# Patient Record
Sex: Female | Born: 1980
Health system: Southern US, Community
[De-identification: ages and names within clinical notes are randomized; demographics above are authoritative.]

## PROBLEM LIST (undated history)

## (undated) DIAGNOSIS — N63 Unspecified lump in unspecified breast: Secondary | ICD-10-CM

## (undated) DIAGNOSIS — N644 Mastodynia: Secondary | ICD-10-CM

## (undated) DIAGNOSIS — Z803 Family history of malignant neoplasm of breast: Secondary | ICD-10-CM

## (undated) DIAGNOSIS — N643 Galactorrhea not associated with childbirth: Secondary | ICD-10-CM

## (undated) HISTORY — PX: INTRAUTERINE DEVICE (IUD) INSERTION: SHX5877

## (undated) HISTORY — DX: Mastodynia: N64.4

## (undated) HISTORY — DX: Family history of malignant neoplasm of breast: Z80.3

## (undated) HISTORY — PX: SINUS SURGERY WITH INSTATRAK: SHX5215

## (undated) HISTORY — DX: Galactorrhea not associated with childbirth: N64.3

## (undated) HISTORY — DX: Unspecified lump in unspecified breast: N63.0

---

## 2004-03-02 ENCOUNTER — Observation Stay: Payer: Self-pay | Admitting: Obstetrics & Gynecology

## 2004-03-05 ENCOUNTER — Inpatient Hospital Stay: Payer: Self-pay | Admitting: Obstetrics & Gynecology

## 2005-03-03 ENCOUNTER — Ambulatory Visit: Payer: Self-pay

## 2005-10-25 ENCOUNTER — Ambulatory Visit: Payer: Self-pay | Admitting: Unknown Physician Specialty

## 2006-04-16 ENCOUNTER — Inpatient Hospital Stay: Payer: Self-pay

## 2011-05-23 DIAGNOSIS — N63 Unspecified lump in unspecified breast: Secondary | ICD-10-CM

## 2011-05-23 HISTORY — DX: Unspecified lump in unspecified breast: N63.0

## 2015-03-17 ENCOUNTER — Ambulatory Visit
Admission: EM | Admit: 2015-03-17 | Discharge: 2015-03-17 | Disposition: A | Discharge status: Home / Self Care | Payer: 59 | Attending: Family Medicine | Admitting: Family Medicine

## 2015-03-17 DIAGNOSIS — M7662 Achilles tendinitis, left leg: Secondary | ICD-10-CM

## 2015-03-17 MED ORDER — NAPROXEN 500 MG PO TABS: 500.0000 mg | ORAL_TABLET | Freq: Two times a day (BID) | ORAL | Status: DC

## 2015-03-17 NOTE — ED Notes (Signed)
Pt states "I have pain in the back of my left ankle ( started Saturday)and my calf is starting to hurt."

## 2015-03-17 NOTE — ED Provider Notes (Signed)
Patient presents today with symptoms of left Achilles tendon pain. Patient states that her symptoms started this weekend when she was jogging uphill at a football game. She states that she was running to the restroom. She started to have symptoms one or 2 hours after that. She denies any history of pain prior to this. She denies any history of ankle or foot problems in the past. She has used ice and taken ibuprofen intermittently for her symptoms since the incident. She has had some minimal calf tenderness which she attributes to the way that she is compensating walking due to her symptoms. She denies any history of DVT. She denies any chest pain or shortness of breath.  ROS: Negative except mentioned above. Vitals as per Epic.  GENERAL: NAD RESP: CTA B CARD: RRR MSK: L Ankle/Foot: no obvious swelling of Achilles or calf, tenderness localized to lateral aspect of insertion of Achilles tendon on calcaneus, no crepitus appreciated, FROM, tenderness reproduced with dorsiflexion, -Thompsons, -Homans, no retrocalcaneal bursitis appreciated, nv intact  NEURO: CN II-XII grossly intact   A/P: L Achilles Pain- Discussed with patient that symptoms suggestive of tendinitis/partial tear. Recommended the patient rest, ice, elevate. Discussed with the patient proper shoewear. At this time we will not do the walking boot but if her symptoms do persist or worsen I would recommend that for a short period of time. I have also given her a PT referral form. This will help her with rehab from this injury, including eccentric stretching when appropriate. If she continues to have any further issues she can follow up with me here at the urgent care or follow up with podiatry. Consider imaging at that time if needed.   Jolene ProvostKirtida Grady Lucci, MD 03/17/15 1041

## 2015-03-19 ENCOUNTER — Ambulatory Visit: Payer: 59 | Attending: Family Medicine | Admitting: Physical Therapy

## 2015-03-19 DIAGNOSIS — M7662 Achilles tendinitis, left leg: Secondary | ICD-10-CM | POA: Diagnosis present

## 2015-03-19 NOTE — Patient Instructions (Signed)
All exercises provided were adapted from hep2go.com. Patient was provided a written handout with pictures as described. Any additional cues were manually entered in to handout and copied in to this document.  ANKLE PUMPS - AP  Bend your foot up and down at your ankle joint as shown.    ** Try in water like a bath-tub.   SEATED CALF STRETCH - GASTROC  While sitting, use a towel or other strap looped around your foot. Gently pull your ankle back until a stretch is felt along the back of your lower leg.

## 2015-03-19 NOTE — Therapy (Signed)
[facility] [address] [place], [place], [address] Phone: [phone]   Fax:  [phone]  Physical Therapy Evaluation  Patient Details  Name: Caitlin Fox MRN: [id number] Date of Birth: 04/11/1981 No Data Recorded  Encounter Date: 03/19/2015      PT End of Session - 03/19/15 1054    Visit Number 1   Number of Visits 7   Date for PT Re-Evaluation 04/16/15   PT Start Time 1008   PT Stop Time 1052   PT Time Calculation (min) 44 min   Activity Tolerance Patient tolerated treatment well   Behavior During Therapy [facility] for tasks assessed/performed      No past medical history on file.  Past Surgical History  Procedure Laterality Date  . Sinus surgery with instatrak    . Cesarean section      There were no vitals filed for this visit.  Visit Diagnosis:  Achilles tendinitis of left lower extremity - Plan: PT plan of care cert/re-cert      Subjective Assessment - 03/19/15 1017    Subjective Patient reports she began feeling pain in her L heel Saturday night. She relates this to running up a hill to see her son's football game. She went to Urgent Care Sunday and was informed  she had a possible strain/tendonitis of her Achilles'.  She denies numbness/tingling, pain comes on with weight bearing and dorsiflexion.    Limitations Walking;Standing   Diagnostic tests Went to    Patient Stated Goals To decrease her pain   Currently in Pain? Yes   Pain Score 1   During ambulation it goes to a 5.   Pain Location Ankle   Pain Orientation Left   Pain Descriptors / Indicators Aching;Burning   Pain Type Acute pain   Pain Frequency Intermittent   Aggravating Factors  Weight bearing activities, Dorsiflexion   Pain Relieving Factors Non-weightbearing.             [facility] - 03/19/15 0001    Assessment   Medical Diagnosis --  L Achilles Tendonitis   Onset Date/Surgical Date 03/13/15   Precautions    Precautions None   Restrictions   Weight Bearing Restrictions No   Balance Screen   Has the patient fallen in the past 6 months No   Prior Function   Level of Independence Independent   Cognition   Overall Cognitive Status Within Functional Limits for tasks assessed   Sensation   Light Touch Appears Intact   PROM   Overall PROM Comments --  Ankle PROM Inv/ever/DF/PF symmetrical bilaterally   Strength   Right Knee Flexion 5/5   Right Knee Extension 5/5   Left Knee Flexion 5/5   Left Knee Extension 5/5   Right Ankle Dorsiflexion 5/5   Right Ankle Plantar Flexion 5/5   Right Ankle Inversion 5/5   Right Ankle Eversion 5/5   Left Ankle Dorsiflexion 5/5   Left Ankle Plantar Flexion 5/5   Left Ankle Inversion 5/5   Left Ankle Eversion 5/5   Palpation   Palpation comment --  Very tender near L spring ligament and Achilles' insertion   Thompson's Test   Findings Negative   Homan's Test   Findings Negative   Great Toe Extension Test    Comments --  WFL and symmetrical   Ambulation/Gait   Gait Comments --  Antalgic gait pattern with decreased stance time on LLE       Mild  deficit noted in DF with AROM pain limited on LLE, symmetrical PROM.  Gastroc stretching with towel with knee straight and 5" holds x 10. Found less painful after session.  Instructed patient in gait with crutch, noted little to no pain with crutch in RUE.                     PT Education - 03/19/15 1053    Education provided Yes   Education Details Patient educated to rest, elevate, ice as frequently as possible. Patient educated to use cane/crutch on RUE if she continues to experience high levels of discomfort as well as comfortable footwear.    Person(s) Educated Patient   Methods Explanation;Demonstration;Handout   Comprehension Verbalized understanding;Returned demonstration             PT Long Term Goals - 03/19/15 1101    PT LONG TERM GOAL #1   Title Patient will ambulate  with no deficits to return to full work duties.   Baseline Antalgic gait pattern.    Status New   PT LONG TERM GOAL #2   Title Patient will report an LEFS of greater than 70 to demonstrate increased tolerance for work related activities.    Baseline Not filled out at eval   Status New   PT LONG TERM GOAL #3   Title Patient will ascend and descend 1 flight of stairs with no increase in pain to return to work related activities.    Baseline Painful with all weightbearing.    Status New               Plan - 03/19/15 1056    Clinical Impression Statement Patient presents with acute onset L achilles insertional pain. Patient demonstrates full PROM, negative Thompson test, full strength on MMT, and tenderness over calacaneal insertion of Achilles, not of plantar fascia. Given her history of running up a hill, requiring significant PF activity it appears this is an acute strain and subsequent inflammatory related pain. Patient felt better after gastroc stretching provided with noticeable change in ambulation. Skilled PT services are indicated to address her pain symptoms.    Pt will benefit from skilled therapeutic intervention in order to improve on the following deficits Abnormal gait;Pain;Decreased mobility;Difficulty walking   Rehab Potential Excellent   Clinical Impairments Affecting Rehab Potential Acute onset, diminished pain   PT Frequency 2x / week   PT Duration 3 weeks   PT Treatment/Interventions Iontophoresis /ml Dexamethasone;Moist Heat;Cryotherapy;Electrical Stimulation;Ultrasound;Therapeutic activities;Therapeutic exercise;Manual techniques;Gait training;Stair training   PT Next Visit Plan Modalities and stretching for pain control.    PT Home Exercise Plan See patient instructions.    Consulted and Agree with Plan of Care Patient         Problem List There are no active problems to display for this patient.  Kerin Ransom, PT, DPT    03/19/2015, 11:11  AM  [facility] MAIN [facility] SERVICES [address] [place], [place], [address] Phone: [phone]   Fax:  [phone]  Name: Caitlin Fox MRN: [id number] Date of Birth: [birth year]

## 2015-03-23 ENCOUNTER — Encounter: Payer: Self-pay | Admitting: Physical Therapy

## 2015-03-23 ENCOUNTER — Ambulatory Visit: Payer: 59 | Attending: Family Medicine | Admitting: Physical Therapy

## 2015-03-23 DIAGNOSIS — M7662 Achilles tendinitis, left leg: Secondary | ICD-10-CM

## 2015-03-23 NOTE — Patient Instructions (Addendum)
  Plantarflexion (Eccentric), (Resistance Band)    Point foot down against resistance band. Slowly release for 3-5 seconds. Use ____red ____ resistance band. _15__ reps per set, __2_ sets per day, _6__ days per week.  http://ecce.exer.us/3   Copyright  VHI. All rights reserved.

## 2015-03-24 NOTE — Therapy (Signed)
[facility] MAIN [facility] SERVICES [address] [place], [place], [address] Phone: [phone]   Fax:  [phone]  Physical Therapy Treatment  Patient Details  Name: Caitlin Fox MRN: [id number] Date of Birth: 05-07-1981 No Data Recorded  Encounter Date: 03/23/2015      PT End of Session - 03/23/15 1820    Visit Number 2   Number of Visits 7   Date for PT Re-Evaluation 04/16/15   PT Start Time 1645   PT Stop Time 1730   PT Time Calculation (min) 45 min   Activity Tolerance Patient tolerated treatment well   Behavior During Therapy [facility] for tasks assessed/performed      History reviewed. No pertinent past medical history.  Past Surgical History  Procedure Laterality Date  . Sinus surgery with instatrak    . Cesarean section      There were no vitals filed for this visit.  Visit Diagnosis:  Achilles tendinitis of left lower extremity      Subjective Assessment - 03/23/15 1653    Subjective Patient reports that she is doing much better since PT evaluation. She states that she has been doing exercises prescribed at PT. She reports that she has not been using crutches and has not been using ankle brace regularly.    Limitations Walking;Standing   How long can you walk comfortably? --   Diagnostic tests --   Patient Stated Goals To decrease her pain   Currently in Pain? No/denies          Treatment:  Gentle L LE AROM:  PF/DF 2x 15  IV/IV 2x 15  BAPs level 2 PF/DF x 15  BAPs level 2 CCW circles x 10 BAPs level 2 CW circles x 10  PT attempted level 3 with BAPs board, but increased pain was noted in the lateral Achilles' tendon insertion, so all BAPs exercises performed at Level 2.   L ankle Eccentric PF red tband 2x10 Self stretch L LE DF with towel in long sitting, 10 second hold x 5   PT was required to provided min-Mod verbal instruction for proper exercise set up, to maintain motion in pain free range, and to  decrease compensation from hip and knee. Patient responded very well to instruction.  PT also provided education to maintain use of Rest, Ice, Compression and elevation as pain and swelling persists. Also instructed to increase use of crutches if pain and swelling increase  PT performed Ultrasound to the L Achilles' tendon insertion for 10 minutes, 1 MHz, 100% duty cycle, 1.7 w/cm2. Patient reports mild decrease in pain following Ultrasound treatment.                           PT Education - 03/23/15 1820    Education provided Yes   Education Details Gentle AROM exercises, ankle eccentrics    Person(s) Educated Patient   Methods Explanation;Demonstration;Tactile cues;Verbal cues   Comprehension Verbalized understanding;Returned demonstration;Verbal cues required             PT Long Term Goals - 03/24/15 1429    PT LONG TERM GOAL #1   Title Patient will ambulate with no deficits to return to full work duties. 04/16/15   Baseline Antalgic gait pattern.    Time 3   Period Weeks   Status New   PT LONG TERM GOAL #2   Title Patient will report an LEFS of greater than 70 to demonstrate  increased tolerance for work related activities. 04/16/15   Baseline Not filled out at eval   Time 3   Period Weeks   Status New   PT LONG TERM GOAL #3   Title Patient will ascend and descend 1 flight of stairs with no increase in pain to return to work related activities. 04/16/15   Baseline Painful with all weightbearing.    Time 3   Period Weeks   Status New               Plan - 03/24/15 [id number]    Clinical Impression Statement Patient demonstrated significant decrease in pain level compared to PT evaluation. Patient instructed in gentle AROM exercises as well as mild eccentric strengthening to aid in Achilles' tendon insertion healing. PT was required to provide min-moderate verbal instruction for proper exercise setup and cues to keep motion in pain free range. Ultrasound  performed on Achilles' tendon insertion to improve healing. Education provided to maintain Ice, compression and elevation for at least another week and to increase use of crutches if pain increases. Continued skilled PT is recommended to decrease pain and improve motion to allow return to PLOF.   Pt will benefit from skilled therapeutic intervention in order to improve on the following deficits Abnormal gait;Pain;Decreased mobility;Difficulty walking   Rehab Potential Excellent   Clinical Impairments Affecting Rehab Potential Acute onset, diminished pain   PT Frequency 2x / week   PT Duration 3 weeks   PT Treatment/Interventions Iontophoresis 4mg /ml Dexamethasone;Moist Heat;Cryotherapy;Electrical Stimulation;Ultrasound;Therapeutic activities;Therapeutic exercise;Manual techniques;Gait training;Stair training   PT Next Visit Plan Modalities and stretching for pain control.    PT Home Exercise Plan See patient instructions.    Consulted and Agree with Plan of Care Patient        Problem List There are no active problems to display for this patient.  Grier Rocherustin Patric Buckhalter SPT 03/24/2015   2:30 PM  This entire session was performed under direct supervision and direction of a licensed therapist . I have personally read, edited and approve of the note as written.  Hopkins,Margaret PT, DPT 03/24/2015, 2:30 PM  [facility] MA[facility] [address] [place], [place], [address] Phone: [phone]   Fax:  [phone]  Name: Caitlin Fox MRN: [id number] Date of Birth: 10/26/1980

## 2015-03-30 ENCOUNTER — Ambulatory Visit: Payer: 59 | Admitting: Physical Therapy

## 2015-03-30 ENCOUNTER — Encounter: Payer: Self-pay | Admitting: Physical Therapy

## 2015-03-30 DIAGNOSIS — M7662 Achilles tendinitis, left leg: Secondary | ICD-10-CM

## 2015-03-30 NOTE — Therapy (Signed)
[facility] [address] [place], [place], [address] Phone: [phone]   Fax:  [phone]  Physical Therapy Treatment  Patient Details  Name: Caitlin DickensKristi B Folks MRN: [id number] Date of Birth: 10/08/1980 No Data Recorded  Encounter Date: 03/30/2015      PT End of Session - 03/30/15 1045    Visit Number 3   Number of Visits 7   Date for PT Re-Evaluation 04/16/15   PT Start Time 0907   PT Stop Time 0949   PT Time Calculation (min) 42 min   Activity Tolerance Patient tolerated treatment well   Behavior During Therapy [facility] for tasks assessed/performed      History reviewed. No pertinent past medical history.  Past Surgical History  Procedure Laterality Date  . Sinus surgery with instatrak    . Cesarean section      There were no vitals filed for this visit.  Visit Diagnosis:  Achilles tendinitis of left lower extremity      Subjective Assessment - 03/30/15 0915    Subjective Patient states that her foot/heel is doing much better today. She report that with walking pain is only 1/10, but states that pain was 4/10 at the end of the day following work yesterday.    Limitations Walking;Standing   Patient Stated Goals To decrease her pain   Currently in Pain? Yes   Pain Score 1    Pain Location Ankle   Pain Orientation Left   Pain Descriptors / Indicators Aching   Pain Type Acute pain   Pain Frequency Intermittent            Treatment:  Gentle L LE AROM:  PF/DF 2x 15  IV/IV 2x 15  BAPs level 2 PF/DF 2x 15  BAPs level 2 IV/EV 2 x 15  BAPs level 2 CCW circles 2x 10 BAPs level 2 CW circles 2 x 10    L ankle Eccentric PF red tband 2x10 L ankle eccentric EV red band 2x 12  PT required to provided min verbal instruction for pain free ROM as well as proper speed of exercises to improve eccentric component. Patient responded very well to instruction.   Following therex.   PT performed manual  therapy including: PROM for the L ankle into DF x 8, 5 second hold  PROM for the L ankle into Inversion x 8 x 5 second hold PROM for the L ankle into eversion x 8 x 4 second hold STM to the Lateral proximal L calf to reduce pain from trigger point. X 3 minutes   Following Manual therapy  PT applied Iontophoresis, with 4mg /mL dexamethasone to L achilles tendon x10 min with instruction on safe wear. Patient reports slight pain after treatment session (1/10)                       PT Education - 03/30/15 [id number]    Education provided Yes   Education Details Gentle AROM, ankle eccentric exercises. benefit of Iontophoresis   Person(s) Educated Patient   Methods Explanation;Demonstration;Verbal cues   Comprehension Verbalized understanding;Returned demonstration;Verbal cues required             PT Long Term Goals - 03/24/15 1429    PT LONG TERM GOAL #1   Title Patient will ambulate with no deficits to return to full work duties. 04/16/15   Baseline Antalgic gait pattern.    Time 3   Period Weeks   Status New  PT LONG TERM GOAL #2   Title Patient will report an LEFS of greater than 70 to demonstrate increased tolerance for work related activities. 04/16/15   Baseline Not filled out at eval   Time 3   Period Weeks   Status New   PT LONG TERM GOAL #3   Title Patient will ascend and descend 1 flight of stairs with no increase in pain to return to work related activities. 04/16/15   Baseline Painful with all weightbearing.    Time 3   Period Weeks   Status New               Plan - 03/30/15 1046    Clinical Impression Statement Patient continues to demonstrate decreased pain levels in the L heel and foot. PT instructed patient in LE ROM exercises as well as gentle eccentrics for the L ankle. Patient required min verbal instruction for improved speed of movement and to focus on eccentric movements with therex. Mild discomfort noted at end range motion for DF  and inversion. PT also performed manual therapy including gentle PROM stretches and STM to trigger point noted in the lateral proximal L calf. Iontophoresis applied to the L calf to help with decreasing inflammation and pain. Continued skilled PT is recommended to decrease pain and improve motion in the L ankle to allow return to PLOF.     Pt will benefit from skilled therapeutic intervention in order to improve on the following deficits Abnormal gait;Pain;Decreased mobility;Difficulty walking   Rehab Potential Excellent   Clinical Impairments Affecting Rehab Potential Acute onset, diminished pain   PT Frequency 2x / week   PT Duration 3 weeks   PT Treatment/Interventions Iontophoresis /ml Dexamethasone;Moist Heat;Cryotherapy;Electrical Stimulation;Ultrasound;Therapeutic activities;Therapeutic exercise;Manual techniques;Gait training;Stair training   PT Next Visit Plan eccentric exercises. modalities.    PT Home Exercise Plan continue as given    Consulted and Agree with Plan of Care Patient        Problem List There are no active problems to display for this patient.  Grier Rocher SPT 03/30/2015   5:09 PM  This entire session was performed under direct supervision and direction of a licensed therapist. I have personally read, edited and approve of the note as written.  Hopkins,Margaret PT, DPT 03/30/2015, 5:09 PM  [facility] MAIN [facility] SERVICES [address] [place], [place], [address] Phone: [phone]   Fax:  [phone]  Name: Caitlin Fox MRN: [id number] Date of Birth: 08-Aug-1980

## 2015-04-06 ENCOUNTER — Encounter: Payer: Self-pay | Admitting: Physical Therapy

## 2015-04-06 ENCOUNTER — Ambulatory Visit: Payer: 59 | Admitting: Physical Therapy

## 2015-04-06 DIAGNOSIS — M7662 Achilles tendinitis, left leg: Secondary | ICD-10-CM | POA: Diagnosis not present

## 2015-04-06 NOTE — Therapy (Signed)
[place] [facility] MAIN [facility] SERVICES [address] [place], [place], [address] Phone: [phone]   Fax:  [phone]  Physical Therapy Treatment/ Discharge summary  Patient Details  Name: Caitlin Fox MRN: [id number] Date of Birth: 1981/01/21 No Data Recorded  Encounter Date: 04/06/2015      PT End of Session - 04/06/15 0934    Visit Number 4   Number of Visits 7   Date for PT Re-Evaluation 04/16/15   PT Start Time 0830   PT Stop Time 0915   PT Time Calculation (min) 45 min   Activity Tolerance Patient tolerated treatment well   Behavior During Therapy [facility] for tasks assessed/performed      History reviewed. No pertinent past medical history.  Past Surgical History  Procedure Laterality Date  . Sinus surgery with instatrak    . Cesarean section      There were no vitals filed for this visit.  Visit Diagnosis:  Achilles tendinitis of left lower extremity      Subjective Assessment - 04/06/15 0933    Subjective Patient states that her foot/heel is doing much better today. She report that with walking pain is only 0/10.    Limitations Walking;Standing   Patient Stated Goals To decrease her pain         PT performed manual therapy including: RROM left ankle DF, PF, inv/eve x 10 x 3 Baps board cW, CCW x 20 STM to the Lateral proximal L calf to reduce pain from trigger point. X 3 minutes [place] to left calf and heel cord x 10 1.5 CM sq.  No reports of pain to left ankle.                         PT Education - 04/06/15 0934    Education provided Yes   Person(s) Educated Patient   Methods Explanation   Comprehension Verbalized understanding             PT Long Term Goals - 03/24/15 1429    PT LONG TERM GOAL #1   Title Patient will ambulate with no deficits to return to full work duties. 04/16/15   Baseline Antalgic gait pattern.    Time 3   Period Weeks   Status New   PT LONG TERM GOAL #2   Title Patient will report an LEFS of greater than 70 to demonstrate increased tolerance for work related activities. 04/16/15   Baseline Not filled out at eval   Time 3   Period Weeks   Status New   PT LONG TERM GOAL #3   Title Patient will ascend and descend 1 flight of stairs with no increase in pain to return to work related activities. 04/16/15   Baseline Painful with all weightbearing.    Time 3   Period Weeks   Status New               Plan - 04/06/15 0935    Clinical Impression Statement Patient is able tp perform resisted exercises including DF, PF, inv/eve with RTB and tolerate [place] to left heel cord. She has no pain after treatment.    Pt will benefit from skilled therapeutic intervention in order to improve on the following deficits Abnormal gait;Pain;Decreased mobility;Difficulty walking   Rehab Potential Excellent   Clinical Impairments Affecting Rehab Potential Acute onset, diminished pain   PT Frequency 2x / week   PT Duration 3 weeks   PT Treatment/Interventions  Iontophoresis 4mg /ml Dexamethasone;Moist Heat;Cryotherapy;Electrical Stimulation;Ultrasound;Therapeutic activities;Therapeutic exercise;Manual techniques;Gait training;Stair training   PT Next Visit Plan eccentric exercises. modalities.    PT Home Exercise Plan continue as given    Consulted and Agree with Plan of Care Patient        Problem List There are no active problems to display for this patient.  Ezekiel InaKristine S Payson Evrard, PT, DPT [place], Barkley BrunsKristine S 04/06/2015, 9:38 AM  [place] [facility] [address] [place], [place], [address] Phone: [phone]   Fax:  [phone]  Name: Waldemar DickensKristi B Pomales MRN: [id number] Date of Birth: [birth year]

## 2015-11-29 ENCOUNTER — Ambulatory Visit: Payer: Self-pay | Admitting: Registered Nurse

## 2015-11-29 ENCOUNTER — Encounter: Payer: Self-pay | Admitting: Physician Assistant

## 2015-11-29 VITALS — BP 100/70 | HR 60 | Temp 97.9°F

## 2015-11-29 DIAGNOSIS — L255 Unspecified contact dermatitis due to plants, except food: Secondary | ICD-10-CM

## 2015-11-29 MED ORDER — PREDNISONE 20 MG PO TABS: 60.0000 mg | ORAL_TABLET | Freq: Every day | ORAL | Status: AC

## 2015-11-29 NOTE — Progress Notes (Signed)
Subjective:    Patient ID: Caitlin Fox, female    DOB: 10-Jan-1981, 35 y.o.   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  HENT: Negative for congestion, dental problem, drooling, ear discharge, ear pain, facial swelling, hearing loss, mouth sores, nosebleeds, postnasal drip, rhinorrhea, sinus pressure, sneezing, sore throat, tinnitus, trouble swallowing and voice change.   Eyes: Negative for photophobia, pain, discharge, redness, itching and visual disturbance.  Respiratory: Negative for cough, choking, chest tightness, shortness of breath, wheezing and stridor.   Cardiovascular: Negative for chest pain, palpitations and leg swelling.  Gastrointestinal: Negative for nausea, vomiting, abdominal pain, diarrhea, constipation, blood in stool and abdominal distention.  Endocrine: Negative for cold intolerance and heat intolerance.  Genitourinary: Negative for dysuria, hematuria and difficulty urinating.  Musculoskeletal: Negative for myalgias, back pain, joint swelling, arthralgias, gait problem, neck pain and neck stiffness.  Skin: Positive for color change and rash. Negative for pallor and wound.  Allergic/Immunologic: Positive for environmental allergies. Negative for food allergies.  Neurological: Negative for dizziness, tremors, seizures, syncope, facial asymmetry, speech difficulty, weakness, light-headedness, numbness and headaches.  Hematological: Negative for  adenopathy. Does not bruise/bleed easily.  Psychiatric/Behavioral: Positive for sleep disturbance. Negative for behavioral problems, confusion and agitation.       Objective:   Physical Exam  Constitutional: She is oriented to person, place, and time. Vital signs are normal. She appears well-developed and well-nourished. She is active and cooperative.  Non-toxic appearance. She does not have a sickly appearance. She does not appear ill. No distress.  HENT:  Head: Normocephalic and atraumatic.  Right Ear: Hearing, external ear and ear canal normal. A middle ear effusion is present.  Left Ear: Hearing, external ear and ear canal normal. A middle ear effusion is present.  Nose: Mucosal edema and rhinorrhea present. No nose lacerations, sinus tenderness, nasal deformity, septal deviation or nasal septal hematoma. No epistaxis.  No foreign bodies. Right sinus exhibits maxillary sinus tenderness and frontal sinus tenderness. Left sinus exhibits no maxillary sinus tenderness and no frontal sinus tenderness.  Mouth/Throat: Uvula is midline and mucous membranes are normal. Mucous membranes are not pale, not dry and not cyanotic. She does not have dentures. No oral lesions. No trismus in the jaw. Normal dentition. No dental abscesses, uvula swelling, lacerations or dental caries. Posterior oropharyngeal edema and posterior oropharyngeal erythema present. No oropharyngeal exudate or tonsillar abscesses.  Eyes: Conjunctivae, EOM and lids are normal. Pupils are equal, round, and reactive to light. Right eye exhibits no chemosis, no discharge, no exudate and no hordeolum. No foreign body present in the right eye. Left eye exhibits no chemosis, no discharge, no exudate and no hordeolum. No foreign body present in the left eye. Right conjunctiva is not injected. Right conjunctiva has no hemorrhage. Left conjunctiva is not injected. Left conjunctiva has no hemorrhage. No scleral icterus. Right eye exhibits normal  extraocular motion and no nystagmus. Left eye exhibits normal extraocular motion and no nystagmus. Right pupil is round and reactive. Left pupil is round and reactive. Pupils  are equal.  Neck: Trachea normal and normal range of motion. Neck supple. No tracheal tenderness, no spinous process tenderness and no muscular tenderness present. No rigidity. No tracheal deviation, no edema, no erythema and normal range of motion present. No thyroid mass and no thyromegaly present.  Cardiovascular: Normal rate, regular rhythm, S1 normal, S2 normal, normal heart sounds and intact distal pulses.  PMI is not displaced.  Exam reveals no gallop and no friction rub.   No murmur heard. Pulmonary/Chest: Effort normal and breath sounds normal. No accessory muscle usage or stridor. No respiratory distress. She has no decreased breath sounds. She has no wheezes. She has no rhonchi. She has no rales. She exhibits no tenderness.  Abdominal: Soft. She exhibits no distension.  Musculoskeletal: Normal range of motion. She exhibits no edema or tenderness.       Right shoulder: Normal.       Left shoulder: Normal.       Right hip: Normal.       Left hip: Normal.       Right knee: Normal.       Left knee: Normal.       Cervical back: Normal.       Right hand: Normal.       Left hand: Normal.  Lymphadenopathy:       Head (right side): No submental and no submandibular adenopathy present.       Head (left side): No submental and no submandibular adenopathy present.    She has no cervical adenopathy.       Right cervical: No superficial cervical adenopathy present.      Left cervical: No superficial cervical adenopathy present.  Neurological: She is alert and oriented to person, place, and time. She has normal strength. She is not disoriented. She displays no atrophy and no tremor. No cranial nerve deficit or sensory deficit. She exhibits normal muscle tone. She displays no seizure activity. Coordination and gait normal. GCS  eye subscore is 4. GCS verbal subscore is 5. GCS motor subscore is 6.  Skin: Skin is warm, dry and intact. Ecchymosis and rash noted. No abrasion, no bruising, no burn, no laceration, no lesion, no petechiae and no purpura noted. Rash is maculopapular. Rash is not macular, not papular, not nodular, not pustular, not vesicular and not urticarial. She is not diaphoretic. There is erythema. No cyanosis. No pallor. Nails show no clubbing.     Psychiatric: She has a normal mood and affect. Her speech is normal and behavior is normal. Judgment and thought content normal. Cognition and memory are normal.  Nursing note and vitals reviewed.         Assessment & Plan:  A- contact dermatitis due to plants  Symptomatic therapy suggested e.g. Calamine lotion, zyrtec 10mg  po qam; benadryl 25-50mg  po bedtime prn breakthrough itching.  60mg  prednisone taper over 21 days  May use ice if breakthrough itching.  Warm to cool water soaks and/or oatmeal baths.  Call or return to clinic as needed if these symptoms worsen or fail to improve as anticipated especially lesions noted on eye, visual changes or visual loss, fever, and/or purulent discharge.    Discussed avoidance/no contact wear of long sleeves/pants/socks/gloves and handkerchief around neck/mouth/face and use of poison ivy block cream along with tepid shower immediately after completion of yard work.  Avoid scratching lesions to prevent secondary infections.   Patient verbalized agreement and understanding of treatment plan and had no further questions at this time.

## 2016-03-15 DIAGNOSIS — Z124 Encounter for screening for malignant neoplasm of cervix: Secondary | ICD-10-CM | POA: Diagnosis not present

## 2016-03-15 DIAGNOSIS — Z01419 Encounter for gynecological examination (general) (routine) without abnormal findings: Secondary | ICD-10-CM | POA: Diagnosis not present

## 2016-03-15 DIAGNOSIS — T8339XA Other mechanical complication of intrauterine contraceptive device, initial encounter: Secondary | ICD-10-CM | POA: Diagnosis not present

## 2016-06-12 DIAGNOSIS — Z30433 Encounter for removal and reinsertion of intrauterine contraceptive device: Secondary | ICD-10-CM | POA: Diagnosis not present

## 2016-07-04 DIAGNOSIS — H52213 Irregular astigmatism, bilateral: Secondary | ICD-10-CM | POA: Diagnosis not present

## 2016-07-13 DIAGNOSIS — Z30431 Encounter for routine checking of intrauterine contraceptive device: Secondary | ICD-10-CM | POA: Diagnosis not present

## 2016-11-13 ENCOUNTER — Ambulatory Visit: Payer: Self-pay | Admitting: Physician Assistant

## 2016-11-13 ENCOUNTER — Ambulatory Visit
Admission: RE | Admit: 2016-11-13 | Discharge: 2016-11-13 | Disposition: A | Discharge status: Home / Self Care | Payer: 59 | Source: Ambulatory Visit | Attending: Physician Assistant | Admitting: Physician Assistant

## 2016-11-13 ENCOUNTER — Encounter: Payer: Self-pay | Admitting: Physician Assistant

## 2016-11-13 VITALS — BP 140/90 | HR 76 | Temp 98.7°F

## 2016-11-13 DIAGNOSIS — M25562 Pain in left knee: Secondary | ICD-10-CM | POA: Diagnosis not present
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
## 2017-10-31 ENCOUNTER — Encounter: Payer: Self-pay | Admitting: Family Medicine

## 2017-10-31 ENCOUNTER — Ambulatory Visit (INDEPENDENT_AMBULATORY_CARE_PROVIDER_SITE_OTHER): Payer: Self-pay | Admitting: Family Medicine

## 2017-10-31 VITALS — BP 130/80 | HR 73 | Temp 98.3°F | Ht 66.0 in | Wt 198.0 lb

## 2017-10-31 DIAGNOSIS — Z0000 Encounter for general adult medical examination without abnormal findings: Secondary | ICD-10-CM

## 2017-10-31 LAB — POCT URINALYSIS DIPSTICK
Bilirubin, UA: NEGATIVE
Glucose, UA: NEGATIVE
KETONES UA: NEGATIVE
NITRITE UA: NEGATIVE
PH UA: 6 (ref 5.0–8.0)
PROTEIN UA: NEGATIVE
Spec Grav, UA: 1.025 (ref 1.010–1.025)
UROBILINOGEN UA: 0.2 U/dL

## 2017-10-31 NOTE — Progress Notes (Signed)
Subjective:  Caitlin DickensKristi B Fox is a 37 y.o. female who presents for wellness visit to satisfy employer health benefit requirements. She is currently without a primary care provider. She is overdue for her PAP smear although has a visit scheduled in July. Family history significant for type 2 diabetes affecting both parents (mother is decreased). She has never obtained a baseline A1C reports an abnormal fasting glucose six months prior. 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 Current Body mass index is 31.96 kg/m.' Patient denies any current health related concerns.  Past Surgical History:  Procedure Laterality Date  . CESAREAN SECTION    . SINUS SURGERY WITH INSTATRAK      Social History   Tobacco Use  . Smoking status: Never Smoker  . Smokeless tobacco: Never Used  Substance Use Topics  . Alcohol use: No  . Drug use: No   No Known Allergies  Current Outpatient Medications  Medication Sig Dispense Refill  . ibuprofen (ADVIL,MOTRIN) 400 MG tablet Take 400 mg by mouth every 6 (six) hours as needed.    . naproxen (NAPROSYN) 500 MG tablet Take 1 tablet (500 mg total) by mouth 2 (two) times daily. (Patient not taking: Reported on 11/29/2015) 30 tablet 0   No current facility-administered medications for this visit.    Review of Systems  Constitutional: Negative.   HENT: Negative.   Eyes:       She wears corrective lenses (contacts)  Respiratory: Negative.   Cardiovascular: Negative.   Gastrointestinal: Negative.   Genitourinary: Negative.   Musculoskeletal: Negative.   Skin: Negative.   Neurological: Negative.   Psychiatric/Behavioral: Negative.    Objective:  BP 130/80   Pulse 73   Temp 98.3 F (36.8 C)   Ht 5\' 6"  (1.676 m)   Wt 198 lb (89.8 kg)   SpO2 99%   BMI 31.96 kg/m     Visual Acuity Screening   Right eye Left eye Both eyes  Without correction:      With correction: 20/20 20/25 20/20     General Appearance:  Alert, cooperative, no distress, appears stated age  Head:  Normocephalic, without obvious abnormality, atraumatic  Eyes:  PERRL, conjunctiva/corneas clear, EOM's intact, fundi benign, both eyes  Ears:  Normal TM's and external ear canals, both ears  Nose: Nares normal, septum midline,mucosa normal, no drainage or sinus tenderness  Throat: Lips, mucosa, and tongue normal; teeth and gums normal  Neck: Supple, symmetrical, trachea midline, no adenopathy;  thyroid: not enlarged, symmetric, no tenderness/mass/nodules; no carotid bruit or JVD  Back:   Symmetric, no curvature, ROM normal, no CVA tenderness  Lungs:   Clear to auscultation bilaterally, respirations unlabored  Heart:  Regular rate and rhythm, S1 and S2 normal, no murmur, rub, or gallop  Abdomen:   Soft, non-tender, bowel sounds active all four quadrants,  no masses, no organomegaly  Pelvic: Deferred  Extremities: Extremities normal, atraumatic, no cyanosis or edema  Pulses: 2+ and symmetric  Skin: Skin color, texture, turgor normal, no rashes or lesions  Lymph nodes: Cervical nodes normal  Neurologic: Normal   Assessment and Plan:  Wellness Examination-Age appropriate anticipatory guidance provided.  RBC noted on urine dipstick. No prior UA on file to compare. Recommended follow-up UA with GYN provider at upcoming appointment. Strong family history of type 2 diabetes-recommended baseline A1C be obtained. Recommended establishing with a PCP and obtaining all routine screening  labs.  Recommended routine physical activity. Patient education provided. Patient will follow up with PCP.  Godfrey Pick. Tiburcio Pea, MSN, FNP-C [facility]  [id number]  [place], [place] [address] [phone]

## 2017-10-31 NOTE — Patient Instructions (Addendum)
Follow-up with and establish with a primary care in order to obtain routine labs. I recommend a obtaining a baseline hemoglobin A1C and repeat urinalysis to evaluate hematuria noted on urine dipstick today.   Preventing Type 2 Diabetes Mellitus Type 2 diabetes (type 2 diabetes mellitus) is a long-term (chronic) disease that affects blood sugar (glucose) levels. Normally, a hormone called insulin allows glucose to enter cells in the body. The cells use glucose for energy. In type 2 diabetes, one or both of these problems may be present:  The body does not make enough insulin.  The body does not respond properly to insulin that it makes (insulin resistance).  Insulin resistance or lack of insulin causes excess glucose to build up in the blood instead of going into cells. As a result, high blood glucose (hyperglycemia) develops, which can cause many complications. Being overweight or obese and having an inactive (sedentary) lifestyle can increase your risk for diabetes. Type 2 diabetes can be delayed or prevented by making certain nutrition and lifestyle changes. What nutrition changes can be made?  Eat healthy meals and snacks regularly. Keep a healthy snack with you for when you get hungry between meals, such as fruit or a handful of nuts.  Eat lean meats and proteins that are low in saturated fats, such as chicken, fish, egg whites, and beans. Avoid processed meats.  Eat plenty of fruits and vegetables and plenty of grains that have not been processed (whole grains). It is recommended that you eat: ? 1?2 cups of fruit every day. ? 2?3 cups of vegetables every day. ? 6?8 oz of whole grains every day, such as oats, whole wheat, bulgur, brown rice, quinoa, and millet.  Eat low-fat dairy products, such as milk, yogurt, and cheese.  Eat foods that contain healthy fats, such as nuts, avocado, olive oil, and canola oil.  Drink water throughout the day. Avoid drinks that contain added sugar,  such as soda or sweet tea.  Follow instructions from your health care provider about specific eating or drinking restrictions.  Control how much food you eat at a time (portion size). ? Check food labels to find out the serving sizes of foods. ? Use a kitchen scale to weigh amounts of foods.  Saute or steam food instead of frying it. Cook with water or broth instead of oils or butter.  Limit your intake of: ? Salt (sodium). Have no more than 1 tsp (2,400 mg) of sodium a day. If you have heart disease or high blood pressure, have less than ? tsp (1,500 mg) of sodium a day. ? Saturated fat. This is fat that is solid at room temperature, such as butter or fat on meat. What lifestyle changes can be made?  Activity  Do moderate-intensity physical activity for at least 30 minutes on at least 5 days of the week, or as much as told by your health care provider.  Ask your health care provider what activities are safe for you. A mix of physical activities may be best, such as walking, swimming, cycling, and strength training.  Try to add physical activity into your day. For example: ? Park in spots that are farther away than usual, so that you walk more. For example, park in a far corner of the parking lot when you go to the office or the grocery store. ? Take a walk during your lunch break. ? Use stairs instead of elevators or escalators. Weight Loss  Lose weight as directed. Your health care  provider can determine how much weight loss is best for you and can help you lose weight safely.  If you are overweight or obese, you may be instructed to lose at least 5?7 % of your body weight. Alcohol and Tobacco   Limit alcohol intake to no more than 1 drink a day for nonpregnant women and 2 drinks a day for men. One drink equals 12 oz of beer, 5 oz of wine, or 1 oz of hard liquor.  Do not use any tobacco products, such as cigarettes, chewing tobacco, and e-cigarettes. If you need help quitting,  ask your health care provider. Work With [place] Provider  Have your blood glucose tested regularly, as told by your health care provider.  Discuss your risk factors and how you can reduce your risk for diabetes.  Get screening tests as told by your health care provider. You may have screening tests regularly, especially if you have certain risk factors for type 2 diabetes.  Make an appointment with a diet and nutrition specialist (registered dietitian). A registered dietitian can help you make a healthy eating plan and can help you understand portion sizes and food labels. Why are these changes important?  It is possible to prevent or delay type 2 diabetes and related health problems by making lifestyle and nutrition changes.  It can be difficult to recognize signs of type 2 diabetes. The best way to avoid possible damage to your body is to take actions to prevent the disease before you develop symptoms. What can happen if changes are not made?  Your blood glucose levels may keep increasing. Having high blood glucose for a long time is dangerous. Too much glucose in your blood can damage your blood vessels, heart, kidneys, nerves, and eyes.  You may develop prediabetes or type 2 diabetes. Type 2 diabetes can lead to many chronic health problems and complications, such as: ? Heart disease. ? Stroke. ? Blindness. ? Kidney disease. ? Depression. ? Poor circulation in the feet and legs, which could lead to surgical removal (amputation) in severe cases. Where to find support:  Ask your health care provider to recommend a registered dietitian, diabetes educator, or weight loss program.  Look for local or online weight loss groups.  Join a gym, fitness club, or outdoor activity group, such as a walking club. Where to find more information: To learn more about diabetes and diabetes prevention, visit:  American Diabetes Association (ADA): www.diabetes.[employer] of  Diabetes and Digestive and Kidney Diseases: FindSpin.nl  To learn more about healthy eating, visit:  The U.S. Department of Agriculture Scientist, research (physical sciences)), Choose My Plate: http://wiley-williams.com/  Office of Disease Prevention and Health Promotion (ODPHP), Dietary Guidelines: SurferLive.at  Summary  You can reduce your risk for type 2 diabetes by increasing your physical activity, eating healthy foods, and losing weight as directed.  Talk with your health care provider about your risk for type 2 diabetes. Ask about any blood tests or screening tests that you need to have. This information is not intended to replace advice given to you by your health care provider. Make sure you discuss any questions you have with your health care provider. Document Released: 08/30/2015 Document Revised: 10/14/2015 Document Reviewed: 06/29/2015 Elsevier Interactive Patient Education  2018 [place] 18-39 Years, Female Preventive care refers to lifestyle choices and visits with your health care provider that can promote health and wellness. What does preventive care include?  A yearly physical  exam. This is also called an annual well check.  Dental exams once or twice a year.  Routine eye exams. Ask your health care provider how often you should have your eyes checked.  Personal lifestyle choices, including: ? Daily care of your teeth and gums. ? Regular physical activity. ? Eating a healthy diet. ? Avoiding tobacco and drug use. ? Limiting alcohol use. ? Practicing safe sex. ? Taking vitamin and mineral supplements as recommended by your health care provider. What happens during an annual well check? The services and screenings done by your health care provider during your annual well check will depend on your age, overall health, lifestyle risk factors, and family history of disease. Counseling Your health care  provider may ask you questions about your:  Alcohol use.  Tobacco use.  Drug use.  Emotional well-being.  Home and relationship well-being.  Sexual activity.  Eating habits.  Work and work Statistician.  Method of birth control.  Menstrual cycle.  Pregnancy history.  Screening You may have the following tests or measurements:  Height, weight, and BMI.  Diabetes screening. This is done by checking your blood sugar (glucose) after you have not eaten for a while (fasting).  Blood pressure.  Lipid and cholesterol levels. These may be checked every 5 years starting at age 53.  Skin check.  Hepatitis C blood test.  Hepatitis B blood test.  Sexually transmitted disease (STD) testing.  BRCA-related cancer screening. This may be done if you have a family history of breast, ovarian, tubal, or peritoneal cancers.  Pelvic exam and Pap test. This may be done every 3 years starting at age 58. Starting at age 76, this may be done every 5 years if you have a Pap test in combination with an HPV test.  Discuss your test results, treatment options, and if necessary, the need for more tests with your health care provider. Vaccines Your health care provider may recommend certain vaccines, such as:  Influenza vaccine. This is recommended every year.  Tetanus, diphtheria, and acellular pertussis (Tdap, Td) vaccine. You may need a Td booster every 10 years.  Varicella vaccine. You may need this if you have not been vaccinated.  HPV vaccine. If you are 21 or younger, you may need three doses over 6 months.  Measles, mumps, and rubella (MMR) vaccine. You may need at least one dose of MMR. You may also need a second dose.  Pneumococcal 13-valent conjugate (PCV13) vaccine. You may need this if you have certain conditions and were not previously vaccinated.  Pneumococcal polysaccharide (PPSV23) vaccine. You may need one or two doses if you smoke cigarettes or if you have certain  conditions.  Meningococcal vaccine. One dose is recommended if you are age 59-21 years and a first-year college student living in a residence hall, or if you have one of several medical conditions. You may also need additional booster doses.  Hepatitis A vaccine. You may need this if you have certain conditions or if you travel or work in places where you may be exposed to hepatitis A.  Hepatitis B vaccine. You may need this if you have certain conditions or if you travel or work in places where you may be exposed to hepatitis B.  Haemophilus influenzae type b (Hib) vaccine. You may need this if you have certain risk factors.  Talk to your health care provider about which screenings and vaccines you need and how often you need them. This information is not intended to replace  advice given to you by your health care provider. Make sure you discuss any questions you have with your health care provider. Document Released: 07/04/2001 Document Revised: 01/26/2016 Document Reviewed: 03/09/2015 Elsevier Interactive Patient Education  [employer].

## 2017-11-26 DIAGNOSIS — H52213 Irregular astigmatism, bilateral: Secondary | ICD-10-CM | POA: Diagnosis not present

## 2017-11-27 ENCOUNTER — Ambulatory Visit (INDEPENDENT_AMBULATORY_CARE_PROVIDER_SITE_OTHER): Payer: 59 | Admitting: Certified Nurse Midwife

## 2017-11-27 ENCOUNTER — Other Ambulatory Visit (HOSPITAL_COMMUNITY)
Admission: RE | Admit: 2017-11-27 | Discharge: 2017-11-27 | Disposition: A | Discharge status: Home / Self Care | Payer: 59 | Source: Ambulatory Visit | Attending: Certified Nurse Midwife | Admitting: Certified Nurse Midwife

## 2017-11-27 ENCOUNTER — Encounter: Payer: Self-pay | Admitting: Certified Nurse Midwife

## 2017-11-27 VITALS — BP 120/80 | HR 72 | Ht 66.0 in | Wt 198.0 lb

## 2017-11-27 DIAGNOSIS — Z124 Encounter for screening for malignant neoplasm of cervix: Secondary | ICD-10-CM

## 2017-11-27 DIAGNOSIS — Z01419 Encounter for gynecological examination (general) (routine) without abnormal findings: Secondary | ICD-10-CM

## 2017-11-27 DIAGNOSIS — Z1231 Encounter for screening mammogram for malignant neoplasm of breast: Secondary | ICD-10-CM

## 2017-11-27 DIAGNOSIS — Z833 Family history of diabetes mellitus: Secondary | ICD-10-CM

## 2017-11-27 DIAGNOSIS — Z131 Encounter for screening for diabetes mellitus: Secondary | ICD-10-CM | POA: Diagnosis not present

## 2017-11-27 DIAGNOSIS — R319 Hematuria, unspecified: Secondary | ICD-10-CM | POA: Diagnosis not present

## 2017-11-27 DIAGNOSIS — Z01411 Encounter for gynecological examination (general) (routine) with abnormal findings: Secondary | ICD-10-CM | POA: Diagnosis not present

## 2017-11-27 DIAGNOSIS — Z803 Family history of malignant neoplasm of breast: Secondary | ICD-10-CM

## 2017-11-27 DIAGNOSIS — Z1239 Encounter for other screening for malignant neoplasm of breast: Secondary | ICD-10-CM

## 2017-11-27 LAB — POCT URINALYSIS DIPSTICK
Bilirubin, UA: NEGATIVE
Glucose, UA: NEGATIVE
KETONES UA: NEGATIVE
Nitrite, UA: NEGATIVE
Protein, UA: NEGATIVE
SPEC GRAV UA: 1.015 (ref 1.010–1.025)
Urobilinogen, UA: NEGATIVE E.U./dL — AB
pH, UA: 7.5 (ref 5.0–8.0)

## 2017-11-27 NOTE — Progress Notes (Signed)
Gynecology Annual Exam  PCP: Patient, No Pcp Per  Chief Complaint:  Chief Complaint  Patient presents with  . Gynecologic Exam    repeat u/a d/t 1+ blood in last test    History of Present Illness: Caitlin Fox is a 37 y.o. WF G2P2002 who presents for her annual exam. The patient has no complaints today. Had a physical exam for her insurance last month and there was +1 blood on dipstick. No dysuria or gross hematuria. No history of kidney stones.  Her menses are absent due to the MIrena IUD.   Last pap smear: 03/15/2016, results were NIL   The patient is sexually active. She currently uses Mirena IUD for contraception. The Mirena was replaced 06/12/2016. She does not have dyspareunia.  Since her last visit, she has graduated with her DNP. She has also gained 18#.   Her past medical history is remarkable for a history of a right breast mass (from adipose tissue) in the UOQ identified by Dr Lemar Livings. She reports symptoms of tenderness cyclically  The patient does perform self breast exams. Her last mammogram was NA  There is a family history of breast cancer in her maternal grandmother Genetic testing has not been done. Patient would like to have a mammogram scheduled due to the young age of her MGM at diagnosis (late 30s/early 48s).  There is no family history of ovarian cancer.   The patient denies smoking.  She denies drinking.   She denies illegal drug use.  The patient reports exercising regularly-walking 20,000 steps daily. Has recently started back going to aerobics classes. She does get adequate calcium in her diet.  Most recent lipid panel was done 2015 and was normal.  The patient denies current symptoms of depression.    Review of Systems: Review of Systems  Constitutional: Negative for chills, fever and weight loss.  HENT: Negative for congestion, sinus pain and sore throat.   Eyes: Negative for blurred vision and pain.  Respiratory: Negative for  hemoptysis, shortness of breath and wheezing.   Cardiovascular: Negative for chest pain, palpitations and leg swelling.  Gastrointestinal: Negative for abdominal pain, blood in stool, diarrhea, heartburn, nausea and vomiting.  Genitourinary: Positive for hematuria (on dipstick). Negative for dysuria, frequency and urgency.       Positive for amenorrhea  Musculoskeletal: Negative for back pain, joint pain and myalgias.  Skin: Negative for itching and rash.  Neurological: Negative for dizziness, tingling and headaches.  Endo/Heme/Allergies: Negative for environmental allergies and polydipsia. Does not bruise/bleed easily.       Negative for hirsutism   Psychiatric/Behavioral: Negative for depression. The patient is not nervous/anxious and does not have insomnia.     Past Medical History:  Past Medical History:  Diagnosis Date  . Breast mass in female 2013   cystic lesion. followed by Dr. Lemar Livings for breast complaints  . Galactorrhea   . Mastodynia, female     Past Surgical History:  Past Surgical History:  Procedure Laterality Date  . CESAREAN SECTION  2007   BVD/ FITL  . INTRAUTERINE DEVICE (IUD) INSERTION  06-14-2006, 06-16-2011, 06/12/2016  . SINUS SURGERY WITH INSTATRAK      Family History:  Family History  Problem Relation Age of Onset  . Diabetes Mother        type 2  . Hypertension Mother   . Pulmonary disease Mother   . Diabetes Father 2       type 2  . Hyperlipidemia  Father   . Hypertension Father   . Breast cancer Maternal Grandmother 92       DIED IN HER 60s respiratory failure  . Hypertension Paternal Grandmother     Social History:  Social History   Socioeconomic History  . Marital status: Married    Spouse name: Not on file  . Number of children: 2  . Years of education: 6  . Highest education level: Not on file  Occupational History  . Occupation: Charity fundraiser  Social Needs  . Financial resource strain: Not on file  . Food insecurity:    Worry: Not on  file    Inability: Not on file  . Transportation needs:    Medical: Not on file    Non-medical: Not on file  Tobacco Use  . Smoking status: Never Smoker  . Smokeless tobacco: Never Used  Substance and Sexual Activity  . Alcohol use: No  . Drug use: No  . Sexual activity: Yes    Birth control/protection: IUD  Lifestyle  . Physical activity:    Days per week: Not on file    Minutes per session: Not on file  . Stress: Not on file  Relationships  . Social connections:    Talks on phone: Not on file    Gets together: Not on file    Attends religious service: Not on file    Active member of club or organization: Not on file    Attends meetings of clubs or organizations: Not on file    Relationship status: Not on file  . Intimate partner violence:    Fear of current or ex partner: Not on file    Emotionally abused: Not on file    Physically abused: Not on file    Forced sexual activity: Not on file  Other Topics Concern  . Not on file  Social History Narrative  . Not on file    Allergies:  No Known Allergies  Medications:  Current Outpatient Medications on File Prior to Visit  Medication Sig Dispense Refill  . ibuprofen (ADVIL,MOTRIN) 400 MG tablet Take 400 mg by mouth every 6 (six) hours as needed.    Marland Kitchen levonorgestrel (MIRENA) 20 MCG/24HR IUD 1 each by Intrauterine route once.     No current facility-administered medications on file prior to visit.    Physical Exam Vitals: BP 120/80   Pulse 72   Ht 5\' 6"  (1.676 m)   Wt 198 lb (89.8 kg)   LMP  (LMP Unknown)   BMI 31.96 kg/m   General: WF in NAD HEENT: normocephalic, anicteric Neck: no thyroid enlargement, no palpable nodules, no cervical lymphadenopathy  Pulmonary: No increased work of breathing, CTAB Cardiovascular: RRR, without murmur  Breast: Breast soft, no tenderness, no palpable nodules or masses, no skin or nipple retraction present, no nipple discharge.  No axillary, infraclavicular or supraclavicular  lymphadenopathy. Abdomen: Soft, non-tender, non-distended.  Umbilicus without lesions.  No hepatomegaly or masses palpable. No evidence of hernia. Genitourinary:  External: Normal external female genitalia.  Normal urethral meatus, normal  Bartholin's and Skene's glands.    Vagina: Normal vaginal mucosa, no evidence of prolapse.    Cervix: Grossly normal in appearance, no bleeding, non-tender, IUD strings present just inside cx os  Uterus: Anteflexed, normal size, shape, and consistency, mobile, and non-tender  Adnexa: No adnexal masses, non-tender  Rectal: deferred  Lymphatic: no evidence of inguinal lymphadenopathy Extremities: no edema, erythema, or tenderness Neurologic: Grossly intact Psychiatric: mood appropriate, affect full  Assessment: 37 y.o. [id number] annual gyn exam Family history of breast cancer Blood in urine on dipstick  Plan:   1) Breast cancer screening - recommend monthly self breast exam. Mammogram was ordered today.  2) Urinalysis and urine culture: refer to urology if +  Microscopic hematuria  3) Cervical cancer screening - Pap was done.   4) Contraception -Mirena-expires 04/2022  5) Hemoglobin A1C for diabetes screening; Lipid panel due 2020  Farrel Connersolleen Karter Haire, CNM

## 2017-11-28 ENCOUNTER — Encounter (INDEPENDENT_AMBULATORY_CARE_PROVIDER_SITE_OTHER): Payer: Self-pay

## 2017-11-28 ENCOUNTER — Encounter: Payer: Self-pay | Admitting: Certified Nurse Midwife

## 2017-11-28 LAB — HEMOGLOBIN A1C
Est. average glucose Bld gHb Est-mCnc: 94 mg/dL
Hgb A1c MFr Bld: 4.9 % (ref 4.8–5.6)

## 2017-11-29 ENCOUNTER — Encounter (INDEPENDENT_AMBULATORY_CARE_PROVIDER_SITE_OTHER): Payer: Self-pay

## 2017-11-29 LAB — URINALYSIS, MICROSCOPIC ONLY
Casts: NONE SEEN /lpf
WBC UA: NONE SEEN /HPF (ref 0–5)

## 2017-11-29 LAB — URINE CULTURE: ORGANISM ID, BACTERIA: NO GROWTH

## 2017-11-30 LAB — CYTOLOGY - PAP
Diagnosis: NEGATIVE
HPV: NOT DETECTED

## 2017-12-10 ENCOUNTER — Encounter: Payer: Self-pay | Admitting: Obstetrics and Gynecology

## 2017-12-20 ENCOUNTER — Ambulatory Visit
Admission: RE | Admit: 2017-12-20 | Discharge: 2017-12-20 | Disposition: A | Discharge status: Home / Self Care | Payer: 59 | Source: Ambulatory Visit | Attending: Certified Nurse Midwife | Admitting: Certified Nurse Midwife

## 2017-12-20 DIAGNOSIS — Z1239 Encounter for other screening for malignant neoplasm of breast: Secondary | ICD-10-CM

## 2017-12-20 DIAGNOSIS — Z1231 Encounter for screening mammogram for malignant neoplasm of breast: Secondary | ICD-10-CM | POA: Insufficient documentation

## 2017-12-20 DIAGNOSIS — Z803 Family history of malignant neoplasm of breast: Secondary | ICD-10-CM

## 2017-12-21 ENCOUNTER — Other Ambulatory Visit: Payer: Self-pay | Admitting: Certified Nurse Midwife

## 2017-12-21 DIAGNOSIS — N6310 Unspecified lump in the right breast, unspecified quadrant: Secondary | ICD-10-CM

## 2017-12-24 ENCOUNTER — Encounter (INDEPENDENT_AMBULATORY_CARE_PROVIDER_SITE_OTHER): Payer: Self-pay

## 2018-01-01 ENCOUNTER — Ambulatory Visit
Admission: RE | Admit: 2018-01-01 | Discharge: 2018-01-01 | Disposition: A | Discharge status: Home / Self Care | Payer: 59 | Source: Ambulatory Visit | Attending: Certified Nurse Midwife | Admitting: Certified Nurse Midwife

## 2018-01-01 DIAGNOSIS — N6310 Unspecified lump in the right breast, unspecified quadrant: Secondary | ICD-10-CM

## 2018-01-01 DIAGNOSIS — R922 Inconclusive mammogram: Secondary | ICD-10-CM | POA: Diagnosis not present

## 2018-05-10 DIAGNOSIS — H18212 Corneal edema secondary to contact lens, left eye: Secondary | ICD-10-CM | POA: Diagnosis not present

## 2018-07-10 ENCOUNTER — Ambulatory Visit: Payer: 59 | Attending: Physician Assistant | Admitting: Physical Therapy

## 2018-07-10 DIAGNOSIS — M5440 Lumbago with sciatica, unspecified side: Secondary | ICD-10-CM | POA: Insufficient documentation

## 2018-07-10 NOTE — Therapy (Signed)
[place] [facility] MAIN [facility] SERVICES [address] [place], [place], [address] Phone: [phone]   Fax:  [phone]  Physical Therapy screen  Patient Details  Name: Caitlin Fox MRN: [id number] Date of Birth: 04-19-1981 No data recorded  Encounter Date: 07/10/2018    Past Medical History:  Diagnosis Date  . Breast mass in female 2013   cystic lesion. followed by Dr. Lemar Livings for breast complaints  . Family history of breast cancer    7/19 Cancer genetic testing letter sent  . Galactorrhea   . Mastodynia, female     Past Surgical History:  Procedure Laterality Date  . CESAREAN SECTION  2007   BVD/ FITL  . INTRAUTERINE DEVICE (IUD) INSERTION  06-14-2006, 06-16-2011, 06/12/2016  . SINUS SURGERY WITH INSTATRAK      There were no vitals filed for this visit.    PT/OT/SLP Screening Form   Time: in 2:45    Time out 3:15   Complaint Patient has low back pain that started 07/09/18 Past Medical Hx: No past medical hx Injury Date: 07/09/18 Pain Scale: 4/10 , ranges from 1/10 -8/10  Patient's phone number: [phone]    Hx (this occurrence): Patient was reaching down to pick up tee shirt off the floor and had a sudden onset of back pain and right leg pain shooting down to the foot. She had trouble standing up straight, sat down and used ice. She can slowly get straight. She is having trouble getting into the car, getting into the bed was difficult . She is having spasms with turning in bed .      Assessment: Numbness and tingling; intact BLE FABER right hip +  Prone knee flex bilaterally +  SLR negative bilaterally  Tender to palpation L1- L5, iliac crests bilaterally  + spring test L1- L5 Strength is WFL BLE Mobiltiy: guarded sit to supine, supine to sit, rolling       Recommendations:  Prone extension x 10 x every hour Sitting with lumbar support  Ice to low back   Comments:    []  Patient would benefit from an MD  referral []  Patient would benefit from a full PT/OT/ SLP evaluation and treatment. [x]  No intervention recommended at this time.                                        Patient will benefit from skilled therapeutic intervention in order to improve the following deficits and impairments:     Visit Diagnosis: Acute bilateral low back pain with sciatica, sciatica laterality unspecified     Problem List Patient Active Problem List   Diagnosis Date Noted  . Family history of breast cancer 11/27/2017  . Family history of diabetes mellitus (DM) 11/27/2017    Ezekiel Ina, PT DPT 07/10/2018, 2:47 PM  [place] [facility] MAIN [facility] SERVICES [address] [place], [place], [address] Phone: [phone]   Fax:  [phone]  Name: Caitlin Fox MRN: [id number] Date of Birth: [birth year]

## 2018-12-25 ENCOUNTER — Ambulatory Visit: Payer: 59 | Admitting: Obstetrics and Gynecology

## 2018-12-25 IMAGING — US US BREAST*R* LIMITED INC AXILLA
1 series · 2 of 2 positions shown · non-contrast
Comparison: None.

CLINICAL DATA: 36-year-old female with a palpable right breast lump
for 2-3 years.

EXAM:
DIGITAL DIAGNOSTIC BILATERAL MAMMOGRAM WITH CAD AND TOMO
ULTRASOUND RIGHT BREAST

[Series 1: us breast*right* limited inc axilla · 0.07mm/px · 2 of 2 slices shown]
[im 1/2]
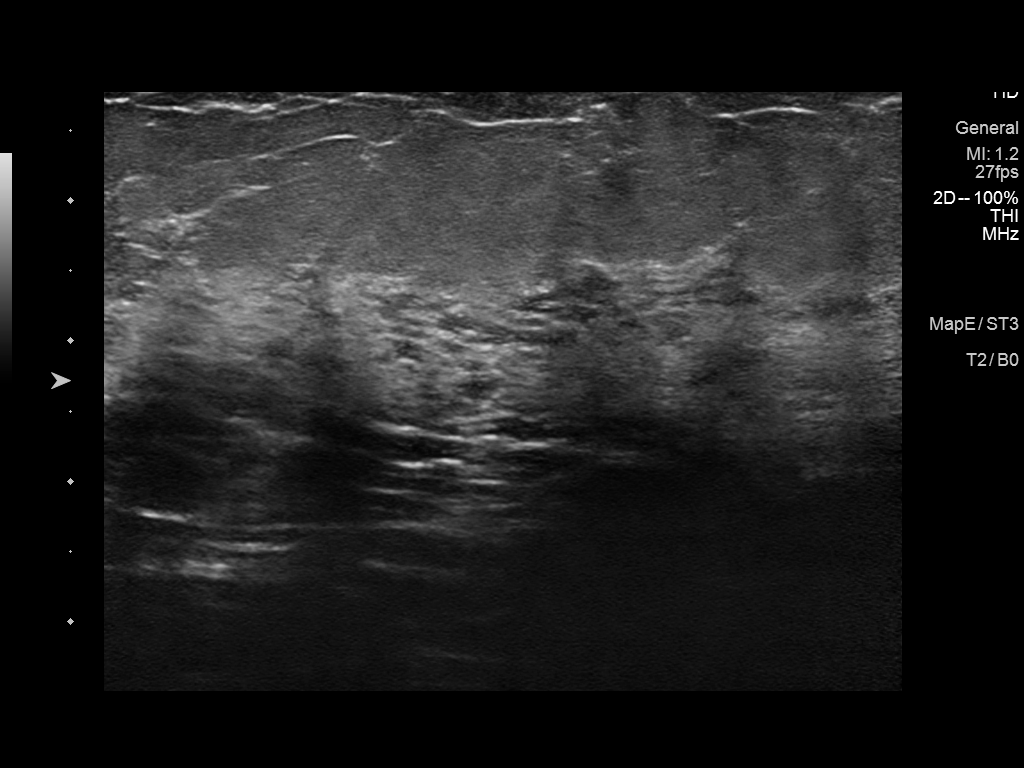
[im 2/2]
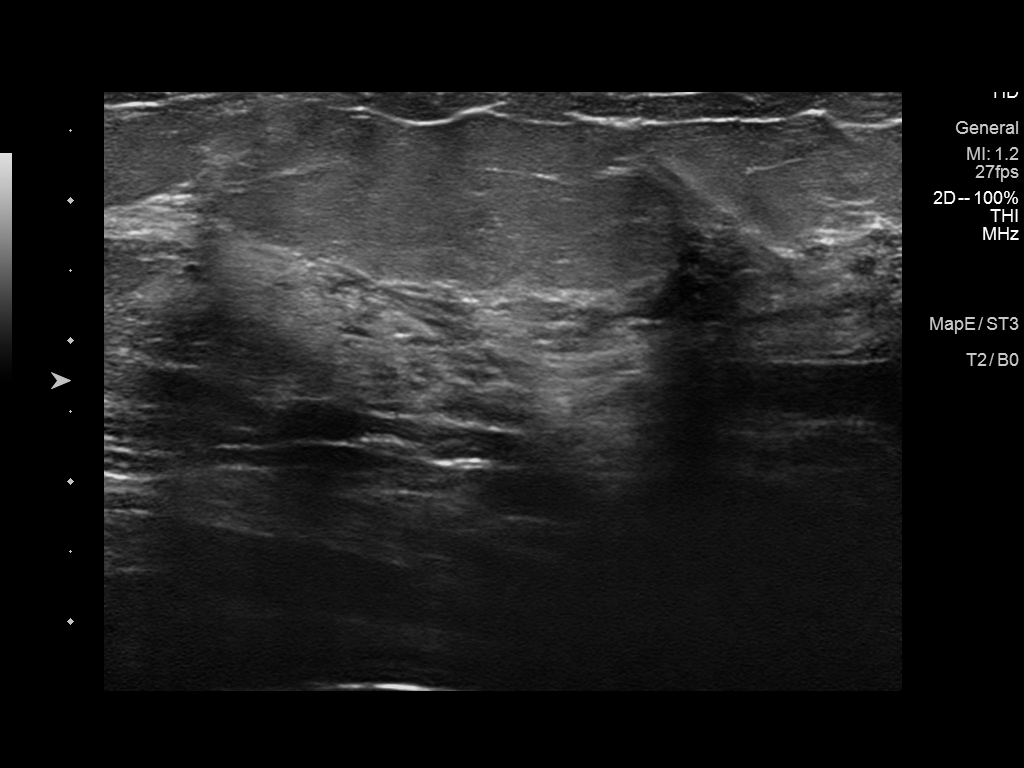

[2 of 2 positions shown; findings below may reference images not displayed]

ACR Breast Density Category d: The breast tissue is extremely dense,
which lowers the sensitivity of mammography.
FINDINGS: Radiopaque BB was placed at the site of the patient's palpable
abnormality in the upper outer quadrant of the right breast at
posterior depth. No suspicious mammographic findings are seen deep
to the radiopaque BB or within the remainder of either breast.

Mammographic images were processed with CAD.

On physical exam, I palpate dense fibroglandular tissue without
focal lump in the upper outer right breast.

Targeted ultrasound is performed, showing normal fibroglandular
tissue without focal or suspicious sonographic finding at the 10
o'clock position 5 cm from nipple.
IMPRESSION: 1. No mammographic evidence of malignancy in either breast.
2. No additional suspicious sonographic findings corresponding with
the patient's right breast palpable lump. This likely reflects an
area of dense fibroglandular tissue.

RECOMMENDATION:
1. Clinical follow-up recommended for the palpable area of concern
in the right breast. Any further workup should be based on clinical
grounds.
2. Screening mammogram at age 41 unless there are persistent or
intervening clinical concerns. (Code:M2-3-EHI)

I have discussed the findings and recommendations with the patient.
Results were also provided in writing at the conclusion of the
visit. If applicable, a reminder letter will be sent to the patient
regarding the next appointment.

BI-RADS CATEGORY  1: Negative.

## 2019-01-06 NOTE — Progress Notes (Signed)
PCP:  Patient, No Pcp Per   Chief Complaint  Patient presents with  . Gynecologic Exam     HPI:      [name] is a 38 y.o. [phone] who LMP was No LMP recorded. (Menstrual status: IUD)., presents today for her annual examination.  Her menses are absent with IUD.   Dysmenorrhea none. She does not have intermenstrual bleeding.  Sex activity: single partner, contraception - IUD. Mirena replaced 06/12/16 Last Pap: 03/15/16  Results were: no abnormalities /neg HPV DNA done 2016 Hx of STDs: none  Last mammogram: 01/01/18 Results: no abnormalities, done due to RT breast pain. Hx of RT breast mass (from adipose tissue) in the UOQ identified by Dr Lemar LivingsByrnett in 2013. Sx improved, no change in area. Declines mammo this yr. There is a FH of breast cancer in her MGM, genetic testing not done. There is no FH of ovarian cancer. The patient does do self-breast exams.   Tobacco use: The patient denies current or previous tobacco use. Alcohol use: none No drug use.  Exercise: moderately active  She does get adequate calcium but not Vitamin D in her diet. WNL fasting labs in recent past.  Past Medical History:  Diagnosis Date  . Breast mass in female 2013   cystic lesion. followed by Dr. Lemar LivingsByrnett for breast complaints  . Family history of breast cancer    7/19 Cancer genetic testing letter sent  . Galactorrhea   . Mastodynia, female     Past Surgical History:  Procedure Laterality Date  . CESAREAN SECTION  2007   BVD/ FITL  . INTRAUTERINE DEVICE (IUD) INSERTION  06-14-2006, 06-16-2011, 06/12/2016  . SINUS SURGERY WITH INSTATRAK      Family History  Problem Relation Age of Onset  . Diabetes Mother        type 2  . Hypertension Mother   . Pulmonary disease Mother   . Diabetes Father [id number]       type 2  . Hyperlipidemia Father   . Hypertension Father   . Breast cancer Maternal Grandmother [id number]       DIED IN HER 60s respiratory failure  . Hypertension Paternal Grandmother      Social History   Socioeconomic History  . Marital status: Married    Spouse name: Not on file  . Number of children: 2  . Years of education: 5616  . Highest education level: Not on file  Occupational History  . Occupation: Charity fundraiserN  Social Needs  . Financial resource strain: Not on file  . Food insecurity    Worry: Not on file    Inability: Not on file  . Transportation needs    Medical: Not on file    Non-medical: Not on file  Tobacco Use  . Smoking status: Never Smoker  . Smokeless tobacco: Never Used  Substance and Sexual Activity  . Alcohol use: No  . Drug use: No  . Sexual activity: Yes    Birth control/protection: I.U.D.    Comment: Mirena  Lifestyle  . Physical activity    Days per week: Not on file    Minutes per session: Not on file  . Stress: Not on file  Relationships  . Social Musicianconnections    Talks on phone: Not on file    Gets together: Not on file    Attends religious service: Not on file    Active member of club or organization: Not on file    Attends meetings of  clubs or organizations: Not on file    Relationship status: Not on file  . Intimate partner violence    Fear of current or ex partner: Not on file    Emotionally abused: Not on file    Physically abused: Not on file    Forced sexual activity: Not on file  Other Topics Concern  . Not on file  Social History Narrative  . Not on file    Outpatient Medications Prior to Visit  Medication Sig Dispense Refill  . levonorgestrel (MIRENA) 20 MCG/24HR IUD 1 each by Intrauterine route once.    Marland Kitchen. ibuprofen (ADVIL,MOTRIN) 400 MG tablet Take 400 mg by mouth every 6 (six) hours as needed.     No facility-administered medications prior to visit.       ROS:  Review of Systems  Constitutional: Negative for fatigue, fever and unexpected weight change.  Respiratory: Negative for cough, shortness of breath and wheezing.   Cardiovascular: Negative for chest pain, palpitations and leg swelling.   Gastrointestinal: Negative for blood in stool, constipation, diarrhea, nausea and vomiting.  Endocrine: Negative for cold intolerance, heat intolerance and polyuria.  Genitourinary: Negative for dyspareunia, dysuria, flank pain, frequency, genital sores, hematuria, menstrual problem, pelvic pain, urgency, vaginal bleeding, vaginal discharge and vaginal pain.  Musculoskeletal: Negative for back pain, joint swelling and myalgias.  Skin: Negative for rash.  Neurological: Negative for dizziness, syncope, light-headedness, numbness and headaches.  Hematological: Negative for adenopathy.  Psychiatric/Behavioral: Negative for agitation, confusion, sleep disturbance and suicidal ideas. The patient is not nervous/anxious.   BREAST: No symptoms   Objective: BP 114/80   Ht 5\' 6"  (1.676 m)   Wt 196 lb (88.9 kg)   BMI 31.64 kg/m    Physical Exam Constitutional:      Appearance: She is well-developed.  Genitourinary:     Vulva, vagina, cervix, uterus, right adnexa and left adnexa normal.     No vulval lesion or tenderness noted.     No vaginal discharge, erythema or tenderness.     No cervical polyp.     IUD strings visualized.     Uterus is not enlarged or tender.     No right or left adnexal mass present.     Right adnexa not tender.     Left adnexa not tender.  Neck:     Musculoskeletal: Normal range of motion.     Thyroid: No thyromegaly.  Cardiovascular:     Rate and Rhythm: Normal rate and regular rhythm.     Heart sounds: Normal heart sounds. No murmur.  Pulmonary:     Effort: Pulmonary effort is normal.     Breath sounds: Normal breath sounds.  Chest:     Breasts:        Right: No mass, nipple discharge, skin change or tenderness.        Left: No mass, nipple discharge, skin change or tenderness.  Abdominal:     Palpations: Abdomen is soft.     Tenderness: There is no abdominal tenderness. There is no guarding.  Musculoskeletal: Normal range of motion.  Neurological:      General: No focal deficit present.     Mental Status: She is alert and oriented to person, place, and time.     Cranial Nerves: No cranial nerve deficit.  Skin:    General: Skin is warm and dry.  Psychiatric:        Mood and Affect: Mood normal.        Behavior: Behavior  normal.        Thought Content: Thought content normal.        Judgment: Judgment normal.  Vitals signs reviewed.     Assessment/Plan: Encounter for annual routine gynecological examination -   Cervical cancer screening - Plan: Cytology - PAP,   Screening for HPV (human papillomavirus) - Plan: Cytology - PAP,   Encounter for routine checking of intrauterine contraceptive device (IUD) - Plan: IUD in place. Due for removal 2023.  Family history of breast cancer - Plan: MyRisk testing discussed, handout given. F/u if desires testing.       GYN counsel adequate intake of calcium and vitamin D, diet and exercise     F/U  Return in about 1 year (around 01/07/2020).  Jorma Tassinari B. Nahjae Hoeg, PA-C 01/07/2019 8:53 AM

## 2019-01-06 NOTE — Patient Instructions (Signed)
I value your feedback and entrusting us with your care. If you get a [place] patient survey, I would appreciate you taking the time to let us know about your experience today. Thank you! 

## 2019-01-07 ENCOUNTER — Other Ambulatory Visit: Payer: Self-pay

## 2019-01-07 ENCOUNTER — Other Ambulatory Visit (HOSPITAL_COMMUNITY)
Admission: RE | Admit: 2019-01-07 | Discharge: 2019-01-07 | Disposition: A | Discharge status: Home / Self Care | Payer: 59 | Source: Ambulatory Visit | Attending: Obstetrics and Gynecology | Admitting: Obstetrics and Gynecology

## 2019-01-07 ENCOUNTER — Encounter: Payer: Self-pay | Admitting: Obstetrics and Gynecology

## 2019-01-07 ENCOUNTER — Ambulatory Visit (INDEPENDENT_AMBULATORY_CARE_PROVIDER_SITE_OTHER): Payer: 59 | Admitting: Obstetrics and Gynecology

## 2019-01-07 VITALS — BP 114/80 | Ht 66.0 in | Wt 196.0 lb

## 2019-01-07 DIAGNOSIS — Z1151 Encounter for screening for human papillomavirus (HPV): Secondary | ICD-10-CM | POA: Diagnosis not present

## 2019-01-07 DIAGNOSIS — Z30431 Encounter for routine checking of intrauterine contraceptive device: Secondary | ICD-10-CM

## 2019-01-07 DIAGNOSIS — Z124 Encounter for screening for malignant neoplasm of cervix: Secondary | ICD-10-CM | POA: Diagnosis not present

## 2019-01-07 DIAGNOSIS — Z803 Family history of malignant neoplasm of breast: Secondary | ICD-10-CM

## 2019-01-07 DIAGNOSIS — Z01419 Encounter for gynecological examination (general) (routine) without abnormal findings: Secondary | ICD-10-CM

## 2019-01-09 LAB — CYTOLOGY - PAP
Diagnosis: NEGATIVE
HPV: NOT DETECTED

## 2019-04-02 ENCOUNTER — Other Ambulatory Visit: Payer: Self-pay

## 2019-04-02 DIAGNOSIS — Z20822 Contact with and (suspected) exposure to covid-19: Secondary | ICD-10-CM

## 2019-04-03 LAB — NOVEL CORONAVIRUS, NAA: SARS-CoV-2, NAA: NOT DETECTED

## 2019-05-30 ENCOUNTER — Ambulatory Visit: Payer: 59 | Attending: Internal Medicine

## 2019-05-30 DIAGNOSIS — Z20822 Contact with and (suspected) exposure to covid-19: Secondary | ICD-10-CM

## 2019-06-01 LAB — NOVEL CORONAVIRUS, NAA: SARS-CoV-2, NAA: NOT DETECTED

## 2019-08-12 ENCOUNTER — Other Ambulatory Visit: Payer: Self-pay

## 2019-08-12 ENCOUNTER — Ambulatory Visit: Payer: 59 | Attending: Physician Assistant | Admitting: Physical Therapy

## 2019-08-12 DIAGNOSIS — M5440 Lumbago with sciatica, unspecified side: Secondary | ICD-10-CM | POA: Insufficient documentation

## 2019-08-12 NOTE — Therapy (Signed)
[place] [facility] MAIN [facility] SERVICES [address] [place], [place], [address] Phone: [phone]   Fax:  [phone]  Physical Therapy Treatment  Patient Details  Name: Caitlin Fox MRN: [id number] Date of Birth: 1980-06-12 No data recorded  Encounter Date: 08/12/2019    Past Medical History:  Diagnosis Date  . Breast mass in female 2013   cystic lesion. followed by Dr. Lemar Livings for breast complaints  . Family history of breast cancer    7/19 Cancer genetic testing letter sent  . Galactorrhea   . Mastodynia, female     Past Surgical History:  Procedure Laterality Date  . CESAREAN SECTION  2007   BVD/ FITL  . INTRAUTERINE DEVICE (IUD) INSERTION  06-14-2006, 06-16-2011, 06/12/2016  . SINUS SURGERY WITH INSTATRAK      There were no vitals filed for this visit.   PT/OT/SLP Screening Form   Time: in 11:05_     Time out 11:30   Complaint low back pain 4/10- 7/10______________ Past Medical Hx: Patient has had one episode of back pain 6 months ago Injury Date:08/12/19 Pain Scale: 4/10-7/10  Patient's phone number:   Hx (this occurrence): Patient was using a glass wiper on the shower this am and leaned fwd and down and her back locked up. She is now not able to sit or stand or transition without spasms and back pain. The right side was a bit numb and now she is not having any symptoms in her LE's.      Assessment: Burning pain 6-8/10 to low back Guarded bed mobility getting into prone, rolling left and right with spasms Strength is [facility] but limited by severe pain in low back ROM is guarded for trunk mobility, with spasms during flex/ext/roation and SB Sensation : WFL BLE  special tests:   + prone spring test T10-L5 + crossed SLR Palpation: Tender to palpation T - 10 to L 5, Sacrum is tender and B iliac crest tenderness Lumbar Paraspinals are tender bilaterally  Tender to manual therapy for PA glides grade  E-stim and ice IFC x  20 mins x 2 sessions Prone push ups x 10 x 2 sets      Recommendations:  Prone extension x 10 every hour , standing extension exercises , lumbar roll   Comments:    []  Patient would benefit from an MD referral [x]  Patient would benefit from a full PT/OT/ SLP evaluation and treatment. []  No intervention recommended at this time.                                         Patient will benefit from skilled therapeutic intervention in order to improve the following deficits and impairments:     Visit Diagnosis: Acute bilateral low back pain with sciatica, sciatica laterality unspecified     Problem List Patient Active Problem List   Diagnosis Date Noted  . Family history of breast cancer 11/27/2017  . Family history of diabetes mellitus (DM) 11/27/2017    , PT DPT 08/12/2019, 11:08 AM  [place] [facility] MAIN [facility] SERVICES [address] [place], [facility], [address] Phone: [phone]   Fax:  [phone]  Name: Caitlin Fox MRN: [id number] Date of Birth: 1980-12-01

## 2020-01-29 NOTE — Patient Instructions (Signed)
I value your feedback and entrusting us with your care. If you get a [facility] patient survey, I would appreciate you taking the time to let us know about your experience today. Thank you!  As of May 01, 2019, your lab results will be released to your MyChart immediately, before I even have a chance to see them. Please give me time to review them and contact you if there are any abnormalities. Thank you for your patience.  

## 2020-01-29 NOTE — Progress Notes (Signed)
PCP:  Patient, No Pcp Per   Chief Complaint  Patient presents with  . Gynecologic Exam     HPI:      [name] is a 39 y.o. [id number] who LMP was No LMP recorded. (Menstrual status: IUD)., presents today for her annual examination.  Her menses are absent with IUD.   Dysmenorrhea none. She does not have intermenstrual bleeding.  Sex activity: single partner, contraception - IUD. Mirena replaced 06/12/16 Last Pap: 01/07/19  Results were: no abnormalities /neg HPV DNA  Hx of STDs: none  Last mammogram: 01/01/18 Results: no abnormalities, done due to RT breast pain. Hx of RT breast mass (from adipose tissue) in the UOQ identified by Dr Lemar Livings in 2013. Sx improved, no change in area. Mammo due age 5  There is a FH of breast cancer in her MGM, genetic testing not indicated. There is no FH of ovarian cancer. The patient does do self-breast exams.   Tobacco use: The patient denies current or previous tobacco use. Alcohol use: none No drug use.  Exercise: moderately active  She does get adequate calcium and Vitamin D in her diet. WNL fasting labs in recent past.  Past Medical History:  Diagnosis Date  . Breast mass in female 2013   cystic lesion. followed by Dr. Lemar Livings for breast complaints  . Family history of breast cancer   . Galactorrhea   . Mastodynia, female     Past Surgical History:  Procedure Laterality Date  . CESAREAN SECTION  2007   BVD/ FITL  . INTRAUTERINE DEVICE (IUD) INSERTION  06-14-2006, 06-16-2011, 06/12/2016  . SINUS SURGERY WITH INSTATRAK      Family History  Problem Relation Age of Onset  . Diabetes Mother        type 2  . Hypertension Mother   . Pulmonary disease Mother   . Diabetes Father 64       type 2  . Hyperlipidemia Father   . Hypertension Father   . Breast cancer Maternal Grandmother        ? > 45; DIED IN HER 60s respiratory failure  . Hypertension Paternal Grandmother     Social History   Socioeconomic History  . Marital  status: Married    Spouse name: Not on file  . Number of children: 2  . Years of education: 63  . Highest education level: Not on file  Occupational History  . Occupation: Charity fundraiser  Tobacco Use  . Smoking status: Never Smoker  . Smokeless tobacco: Never Used  Vaping Use  . Vaping Use: Never used  Substance and Sexual Activity  . Alcohol use: No  . Drug use: No  . Sexual activity: Yes    Birth control/protection: I.U.D.    Comment: Mirena  Other Topics Concern  . Not on file  Social History Narrative  . Not on file   Social Determinants of Health   Financial Resource Strain:   . Difficulty of Paying Living Expenses: Not on file  Food Insecurity:   . Worried About Programme researcher, broadcasting/film/video in the Last Year: Not on file  . Ran Out of Food in the Last Year: Not on file  Transportation Needs:   . Lack of Transportation (Medical): Not on file  . Lack of Transportation (Non-Medical): Not on file  Physical Activity:   . Days of Exercise per Week: Not on file  . Minutes of Exercise per Session: Not on file  Stress:   . Feeling  of Stress : Not on file  Social Connections:   . Frequency of Communication with Friends and Family: Not on file  . Frequency of Social Gatherings with Friends and Family: Not on file  . Attends Religious Services: Not on file  . Active Member of Clubs or Organizations: Not on file  . Attends Banker Meetings: Not on file  . Marital Status: Not on file  Intimate Partner Violence:   . Fear of Current or Ex-Partner: Not on file  . Emotionally Abused: Not on file  . Physically Abused: Not on file  . Sexually Abused: Not on file    Outpatient Medications Prior to Visit  Medication Sig Dispense Refill  . ibuprofen (ADVIL,MOTRIN) 400 MG tablet Take 400 mg by mouth every 6 (six) hours as needed.    Marland Kitchen levonorgestrel (MIRENA) 20 MCG/24HR IUD 1 each by Intrauterine route once.     No facility-administered medications prior to visit.       ROS:  Review of Systems  Constitutional: Negative for fatigue, fever and unexpected weight change.  Respiratory: Negative for cough, shortness of breath and wheezing.   Cardiovascular: Negative for chest pain, palpitations and leg swelling.  Gastrointestinal: Negative for blood in stool, constipation, diarrhea, nausea and vomiting.  Endocrine: Negative for cold intolerance, heat intolerance and polyuria.  Genitourinary: Negative for dyspareunia, dysuria, flank pain, frequency, genital sores, hematuria, menstrual problem, pelvic pain, urgency, vaginal bleeding, vaginal discharge and vaginal pain.  Musculoskeletal: Negative for back pain, joint swelling and myalgias.  Skin: Negative for rash.  Neurological: Negative for dizziness, syncope, light-headedness, numbness and headaches.  Hematological: Negative for adenopathy.  Psychiatric/Behavioral: Negative for agitation, confusion, sleep disturbance and suicidal ideas. The patient is not nervous/anxious.   BREAST: No symptoms   Objective: BP 118/70   Ht 5\' 6"  (1.676 m)   Wt 197 lb (89.4 kg)   BMI 31.80 kg/m    Physical Exam Constitutional:      Appearance: She is well-developed.  Genitourinary:     Vulva, vagina, cervix, uterus, right adnexa and left adnexa normal.     No vulval lesion or tenderness noted.     No vaginal discharge, erythema or tenderness.     No cervical polyp.     IUD strings visualized.     Uterus is not enlarged or tender.     No right or left adnexal mass present.     Right adnexa not tender.     Left adnexa not tender.  Neck:     Thyroid: No thyromegaly.  Cardiovascular:     Rate and Rhythm: Normal rate and regular rhythm.     Heart sounds: Normal heart sounds. No murmur heard.   Pulmonary:     Effort: Pulmonary effort is normal.     Breath sounds: Normal breath sounds.  Chest:     Breasts:        Right: No mass, nipple discharge, skin change or tenderness.        Left: No mass, nipple  discharge, skin change or tenderness.  Abdominal:     Palpations: Abdomen is soft.     Tenderness: There is no abdominal tenderness. There is no guarding.  Musculoskeletal:        General: Normal range of motion.     Cervical back: Normal range of motion.  Neurological:     General: No focal deficit present.     Mental Status: She is alert and oriented to person, place, and time.  Cranial Nerves: No cranial nerve deficit.  Skin:    General: Skin is warm and dry.  Psychiatric:        Mood and Affect: Mood normal.        Behavior: Behavior normal.        Thought Content: Thought content normal.        Judgment: Judgment normal.  Vitals reviewed.     Assessment/Plan: Encounter for annual routine gynecological examination  Encounter for routine checking of intrauterine contraceptive device (IUD); IUD in place. Due for rem 06/12/22      GYN counsel adequate intake of calcium and vitamin D, diet and exercise     F/U  Return in about 1 year (around 01/29/2021).  Avani Sensabaugh B. Shametra Cumberland, PA-C 01/30/2020 9:32 AM

## 2020-01-30 ENCOUNTER — Ambulatory Visit (INDEPENDENT_AMBULATORY_CARE_PROVIDER_SITE_OTHER): Payer: 59 | Admitting: Obstetrics and Gynecology

## 2020-01-30 ENCOUNTER — Other Ambulatory Visit: Payer: Self-pay

## 2020-01-30 ENCOUNTER — Encounter: Payer: Self-pay | Admitting: Obstetrics and Gynecology

## 2020-01-30 VITALS — BP 118/70 | Ht 66.0 in | Wt 197.0 lb

## 2020-01-30 DIAGNOSIS — Z30431 Encounter for routine checking of intrauterine contraceptive device: Secondary | ICD-10-CM

## 2020-01-30 DIAGNOSIS — Z01419 Encounter for gynecological examination (general) (routine) without abnormal findings: Secondary | ICD-10-CM | POA: Diagnosis not present

## 2020-07-29 DIAGNOSIS — H524 Presbyopia: Secondary | ICD-10-CM | POA: Diagnosis not present

## 2021-08-24 DIAGNOSIS — M9905 Segmental and somatic dysfunction of pelvic region: Secondary | ICD-10-CM | POA: Diagnosis not present

## 2021-08-24 DIAGNOSIS — M9902 Segmental and somatic dysfunction of thoracic region: Secondary | ICD-10-CM | POA: Diagnosis not present

## 2021-08-24 DIAGNOSIS — M9903 Segmental and somatic dysfunction of lumbar region: Secondary | ICD-10-CM | POA: Diagnosis not present

## 2021-08-24 DIAGNOSIS — M546 Pain in thoracic spine: Secondary | ICD-10-CM | POA: Diagnosis not present

## 2021-08-24 DIAGNOSIS — M5441 Lumbago with sciatica, right side: Secondary | ICD-10-CM | POA: Diagnosis not present

## 2021-08-29 DIAGNOSIS — M9903 Segmental and somatic dysfunction of lumbar region: Secondary | ICD-10-CM | POA: Diagnosis not present

## 2021-08-29 DIAGNOSIS — M9902 Segmental and somatic dysfunction of thoracic region: Secondary | ICD-10-CM | POA: Diagnosis not present

## 2021-08-29 DIAGNOSIS — M9905 Segmental and somatic dysfunction of pelvic region: Secondary | ICD-10-CM | POA: Diagnosis not present

## 2021-08-31 DIAGNOSIS — M9903 Segmental and somatic dysfunction of lumbar region: Secondary | ICD-10-CM | POA: Diagnosis not present

## 2021-08-31 DIAGNOSIS — M9905 Segmental and somatic dysfunction of pelvic region: Secondary | ICD-10-CM | POA: Diagnosis not present

## 2021-08-31 DIAGNOSIS — M9902 Segmental and somatic dysfunction of thoracic region: Secondary | ICD-10-CM | POA: Diagnosis not present

## 2021-09-02 DIAGNOSIS — M9902 Segmental and somatic dysfunction of thoracic region: Secondary | ICD-10-CM | POA: Diagnosis not present

## 2021-09-02 DIAGNOSIS — M9903 Segmental and somatic dysfunction of lumbar region: Secondary | ICD-10-CM | POA: Diagnosis not present

## 2021-09-02 DIAGNOSIS — M9905 Segmental and somatic dysfunction of pelvic region: Secondary | ICD-10-CM | POA: Diagnosis not present

## 2021-09-05 DIAGNOSIS — M9903 Segmental and somatic dysfunction of lumbar region: Secondary | ICD-10-CM | POA: Diagnosis not present

## 2021-09-05 DIAGNOSIS — M9902 Segmental and somatic dysfunction of thoracic region: Secondary | ICD-10-CM | POA: Diagnosis not present

## 2021-09-05 DIAGNOSIS — M9905 Segmental and somatic dysfunction of pelvic region: Secondary | ICD-10-CM | POA: Diagnosis not present

## 2021-09-07 DIAGNOSIS — M9903 Segmental and somatic dysfunction of lumbar region: Secondary | ICD-10-CM | POA: Diagnosis not present

## 2021-09-07 DIAGNOSIS — M9902 Segmental and somatic dysfunction of thoracic region: Secondary | ICD-10-CM | POA: Diagnosis not present

## 2021-09-07 DIAGNOSIS — M9905 Segmental and somatic dysfunction of pelvic region: Secondary | ICD-10-CM | POA: Diagnosis not present

## 2021-09-09 DIAGNOSIS — M9902 Segmental and somatic dysfunction of thoracic region: Secondary | ICD-10-CM | POA: Diagnosis not present

## 2021-09-09 DIAGNOSIS — M9905 Segmental and somatic dysfunction of pelvic region: Secondary | ICD-10-CM | POA: Diagnosis not present

## 2021-09-09 DIAGNOSIS — M9903 Segmental and somatic dysfunction of lumbar region: Secondary | ICD-10-CM | POA: Diagnosis not present

## 2021-09-12 DIAGNOSIS — M9903 Segmental and somatic dysfunction of lumbar region: Secondary | ICD-10-CM | POA: Diagnosis not present

## 2021-09-12 DIAGNOSIS — M9902 Segmental and somatic dysfunction of thoracic region: Secondary | ICD-10-CM | POA: Diagnosis not present

## 2021-09-12 DIAGNOSIS — M9905 Segmental and somatic dysfunction of pelvic region: Secondary | ICD-10-CM | POA: Diagnosis not present

## 2021-09-14 DIAGNOSIS — M9903 Segmental and somatic dysfunction of lumbar region: Secondary | ICD-10-CM | POA: Diagnosis not present

## 2021-09-14 DIAGNOSIS — M9902 Segmental and somatic dysfunction of thoracic region: Secondary | ICD-10-CM | POA: Diagnosis not present

## 2021-09-14 DIAGNOSIS — M546 Pain in thoracic spine: Secondary | ICD-10-CM | POA: Diagnosis not present

## 2021-09-14 DIAGNOSIS — M9905 Segmental and somatic dysfunction of pelvic region: Secondary | ICD-10-CM | POA: Diagnosis not present

## 2021-09-16 DIAGNOSIS — M546 Pain in thoracic spine: Secondary | ICD-10-CM | POA: Diagnosis not present

## 2021-09-16 DIAGNOSIS — M9905 Segmental and somatic dysfunction of pelvic region: Secondary | ICD-10-CM | POA: Diagnosis not present

## 2021-09-16 DIAGNOSIS — M9903 Segmental and somatic dysfunction of lumbar region: Secondary | ICD-10-CM | POA: Diagnosis not present

## 2021-09-16 DIAGNOSIS — M9902 Segmental and somatic dysfunction of thoracic region: Secondary | ICD-10-CM | POA: Diagnosis not present

## 2021-09-20 DIAGNOSIS — M9905 Segmental and somatic dysfunction of pelvic region: Secondary | ICD-10-CM | POA: Diagnosis not present

## 2021-09-20 DIAGNOSIS — M9903 Segmental and somatic dysfunction of lumbar region: Secondary | ICD-10-CM | POA: Diagnosis not present

## 2021-09-20 DIAGNOSIS — M9902 Segmental and somatic dysfunction of thoracic region: Secondary | ICD-10-CM | POA: Diagnosis not present

## 2021-09-22 DIAGNOSIS — M9902 Segmental and somatic dysfunction of thoracic region: Secondary | ICD-10-CM | POA: Diagnosis not present

## 2021-09-22 DIAGNOSIS — M9903 Segmental and somatic dysfunction of lumbar region: Secondary | ICD-10-CM | POA: Diagnosis not present

## 2021-09-22 DIAGNOSIS — M9905 Segmental and somatic dysfunction of pelvic region: Secondary | ICD-10-CM | POA: Diagnosis not present

## 2021-09-26 DIAGNOSIS — M9905 Segmental and somatic dysfunction of pelvic region: Secondary | ICD-10-CM | POA: Diagnosis not present

## 2021-09-26 DIAGNOSIS — M9903 Segmental and somatic dysfunction of lumbar region: Secondary | ICD-10-CM | POA: Diagnosis not present

## 2021-09-26 DIAGNOSIS — M9902 Segmental and somatic dysfunction of thoracic region: Secondary | ICD-10-CM | POA: Diagnosis not present

## 2021-09-30 DIAGNOSIS — M9903 Segmental and somatic dysfunction of lumbar region: Secondary | ICD-10-CM | POA: Diagnosis not present

## 2021-09-30 DIAGNOSIS — M9905 Segmental and somatic dysfunction of pelvic region: Secondary | ICD-10-CM | POA: Diagnosis not present

## 2021-09-30 DIAGNOSIS — M9902 Segmental and somatic dysfunction of thoracic region: Secondary | ICD-10-CM | POA: Diagnosis not present

## 2021-10-03 DIAGNOSIS — M9902 Segmental and somatic dysfunction of thoracic region: Secondary | ICD-10-CM | POA: Diagnosis not present

## 2021-10-03 DIAGNOSIS — M9903 Segmental and somatic dysfunction of lumbar region: Secondary | ICD-10-CM | POA: Diagnosis not present

## 2021-10-03 DIAGNOSIS — M9905 Segmental and somatic dysfunction of pelvic region: Secondary | ICD-10-CM | POA: Diagnosis not present

## 2021-10-10 DIAGNOSIS — M9905 Segmental and somatic dysfunction of pelvic region: Secondary | ICD-10-CM | POA: Diagnosis not present

## 2021-10-10 DIAGNOSIS — M9902 Segmental and somatic dysfunction of thoracic region: Secondary | ICD-10-CM | POA: Diagnosis not present

## 2021-10-10 DIAGNOSIS — M9903 Segmental and somatic dysfunction of lumbar region: Secondary | ICD-10-CM | POA: Diagnosis not present

## 2021-10-12 DIAGNOSIS — M5451 Vertebrogenic low back pain: Secondary | ICD-10-CM | POA: Diagnosis not present

## 2021-10-12 DIAGNOSIS — M9905 Segmental and somatic dysfunction of pelvic region: Secondary | ICD-10-CM | POA: Diagnosis not present

## 2021-10-12 DIAGNOSIS — M9903 Segmental and somatic dysfunction of lumbar region: Secondary | ICD-10-CM | POA: Diagnosis not present

## 2021-10-12 DIAGNOSIS — M9902 Segmental and somatic dysfunction of thoracic region: Secondary | ICD-10-CM | POA: Diagnosis not present

## 2021-10-18 DIAGNOSIS — M9903 Segmental and somatic dysfunction of lumbar region: Secondary | ICD-10-CM | POA: Diagnosis not present

## 2021-10-18 DIAGNOSIS — M9902 Segmental and somatic dysfunction of thoracic region: Secondary | ICD-10-CM | POA: Diagnosis not present

## 2021-10-18 DIAGNOSIS — M9905 Segmental and somatic dysfunction of pelvic region: Secondary | ICD-10-CM | POA: Diagnosis not present

## 2021-10-24 DIAGNOSIS — M9902 Segmental and somatic dysfunction of thoracic region: Secondary | ICD-10-CM | POA: Diagnosis not present

## 2021-10-24 DIAGNOSIS — M9903 Segmental and somatic dysfunction of lumbar region: Secondary | ICD-10-CM | POA: Diagnosis not present

## 2021-10-24 DIAGNOSIS — M9905 Segmental and somatic dysfunction of pelvic region: Secondary | ICD-10-CM | POA: Diagnosis not present

## 2021-10-31 DIAGNOSIS — M9903 Segmental and somatic dysfunction of lumbar region: Secondary | ICD-10-CM | POA: Diagnosis not present

## 2021-10-31 DIAGNOSIS — M9902 Segmental and somatic dysfunction of thoracic region: Secondary | ICD-10-CM | POA: Diagnosis not present

## 2021-10-31 DIAGNOSIS — M9905 Segmental and somatic dysfunction of pelvic region: Secondary | ICD-10-CM | POA: Diagnosis not present

## 2021-11-09 DIAGNOSIS — M9903 Segmental and somatic dysfunction of lumbar region: Secondary | ICD-10-CM | POA: Diagnosis not present

## 2021-11-09 DIAGNOSIS — M9905 Segmental and somatic dysfunction of pelvic region: Secondary | ICD-10-CM | POA: Diagnosis not present

## 2021-11-09 DIAGNOSIS — M9902 Segmental and somatic dysfunction of thoracic region: Secondary | ICD-10-CM | POA: Diagnosis not present

## 2021-11-15 DIAGNOSIS — M9905 Segmental and somatic dysfunction of pelvic region: Secondary | ICD-10-CM | POA: Diagnosis not present

## 2021-11-15 DIAGNOSIS — M9903 Segmental and somatic dysfunction of lumbar region: Secondary | ICD-10-CM | POA: Diagnosis not present

## 2021-11-15 DIAGNOSIS — M9902 Segmental and somatic dysfunction of thoracic region: Secondary | ICD-10-CM | POA: Diagnosis not present

## 2021-11-24 DIAGNOSIS — M9902 Segmental and somatic dysfunction of thoracic region: Secondary | ICD-10-CM | POA: Diagnosis not present

## 2021-11-24 DIAGNOSIS — M9905 Segmental and somatic dysfunction of pelvic region: Secondary | ICD-10-CM | POA: Diagnosis not present

## 2021-11-24 DIAGNOSIS — M9903 Segmental and somatic dysfunction of lumbar region: Secondary | ICD-10-CM | POA: Diagnosis not present

## 2021-12-01 DIAGNOSIS — M25551 Pain in right hip: Secondary | ICD-10-CM | POA: Diagnosis not present

## 2021-12-01 DIAGNOSIS — M9905 Segmental and somatic dysfunction of pelvic region: Secondary | ICD-10-CM | POA: Diagnosis not present

## 2021-12-01 DIAGNOSIS — M9903 Segmental and somatic dysfunction of lumbar region: Secondary | ICD-10-CM | POA: Diagnosis not present

## 2021-12-01 DIAGNOSIS — M9902 Segmental and somatic dysfunction of thoracic region: Secondary | ICD-10-CM | POA: Diagnosis not present

## 2021-12-09 DIAGNOSIS — M25551 Pain in right hip: Secondary | ICD-10-CM | POA: Diagnosis not present

## 2021-12-09 DIAGNOSIS — M9905 Segmental and somatic dysfunction of pelvic region: Secondary | ICD-10-CM | POA: Diagnosis not present

## 2021-12-09 DIAGNOSIS — M9902 Segmental and somatic dysfunction of thoracic region: Secondary | ICD-10-CM | POA: Diagnosis not present

## 2021-12-09 DIAGNOSIS — M9903 Segmental and somatic dysfunction of lumbar region: Secondary | ICD-10-CM | POA: Diagnosis not present

## 2021-12-20 DIAGNOSIS — H524 Presbyopia: Secondary | ICD-10-CM | POA: Diagnosis not present

## 2022-01-04 NOTE — Progress Notes (Unsigned)
PCP:  Patient, No Pcp Per   No chief complaint on file.    HPI:      [name] is a 41 y.o. [phone] who LMP was No LMP recorded. (Menstrual status: IUD)., presents today for her annual examination.  Her menses are absent with IUD.   Dysmenorrhea none. She does not have intermenstrual bleeding.  Sex activity: single partner, contraception - IUD. Mirena replaced 06/12/16 Last Pap: 01/07/19  Results were: no abnormalities /neg HPV DNA  Hx of STDs: none  Last mammogram: 01/01/18 Results: no abnormalities, done due to RT breast pain. Hx of RT breast mass (from adipose tissue) in the UOQ identified by Dr Lemar Livings in 2013. Sx improved, no change in area. Mammo due age 70  There is a FH of breast cancer in her MGM, genetic testing not indicated. There is no FH of ovarian cancer. The patient does do self-breast exams.   Tobacco use: The patient denies current or previous tobacco use. Alcohol use: none No drug use.  Exercise: moderately active  She does get adequate calcium and Vitamin D in her diet. WNL fasting labs in recent past.  Past Medical History:  Diagnosis Date   Breast mass in female 2013   cystic lesion. followed by Dr. Lemar Livings for breast complaints   Family history of breast cancer    Galactorrhea    Mastodynia, female     Past Surgical History:  Procedure Laterality Date   CESAREAN SECTION  2007   BVD/ FITL   INTRAUTERINE DEVICE (IUD) INSERTION  06-14-2006, 06-16-2011, 06/12/2016   SINUS SURGERY WITH INSTATRAK      Family History  Problem Relation Age of Onset   Diabetes Mother        type 2   Hypertension Mother    Pulmonary disease Mother    Diabetes Father 15       type 2   Hyperlipidemia Father    Hypertension Father    Breast cancer Maternal Grandmother        ? > 45; DIED IN HER 60s respiratory failure   Hypertension Paternal Grandmother     Social History   Socioeconomic History   Marital status: Married    Spouse name: Not on file    Number of children: 2   Years of education: 16   Highest education level: Not on file  Occupational History   Occupation: Charity fundraiser  Tobacco Use   Smoking status: Never   Smokeless tobacco: Never  Vaping Use   Vaping Use: Never used  Substance and Sexual Activity   Alcohol use: No   Drug use: No   Sexual activity: Yes    Birth control/protection: I.U.D.    Comment: Mirena  Other Topics Concern   Not on file  Social History Narrative   Not on file   Social Determinants of Health   Financial Resource Strain: Not on file  Food Insecurity: Not on file  Transportation Needs: Not on file  Physical Activity: Not on file  Stress: Not on file  Social Connections: Not on file  Intimate Partner Violence: Not on file    Outpatient Medications Prior to Visit  Medication Sig Dispense Refill   ibuprofen (ADVIL,MOTRIN) 400 MG tablet Take 400 mg by mouth every 6 (six) hours as needed.     levonorgestrel (MIRENA) 20 MCG/24HR IUD 1 each by Intrauterine route once.     No facility-administered medications prior to visit.      ROS:  Review  of Systems  Constitutional:  Negative for fatigue, fever and unexpected weight change.  Respiratory:  Negative for cough, shortness of breath and wheezing.   Cardiovascular:  Negative for chest pain, palpitations and leg swelling.  Gastrointestinal:  Negative for blood in stool, constipation, diarrhea, nausea and vomiting.  Endocrine: Negative for cold intolerance, heat intolerance and polyuria.  Genitourinary:  Negative for dyspareunia, dysuria, flank pain, frequency, genital sores, hematuria, menstrual problem, pelvic pain, urgency, vaginal bleeding, vaginal discharge and vaginal pain.  Musculoskeletal:  Negative for back pain, joint swelling and myalgias.  Skin:  Negative for rash.  Neurological:  Negative for dizziness, syncope, light-headedness, numbness and headaches.  Hematological:  Negative for adenopathy.  Psychiatric/Behavioral:  Negative for  agitation, confusion, sleep disturbance and suicidal ideas. The patient is not nervous/anxious.   BREAST: No symptoms   Objective: There were no vitals taken for this visit.   Physical Exam Constitutional:      Appearance: She is well-developed.  Genitourinary:     Vulva normal.     No vaginal discharge, erythema or tenderness.      Right Adnexa: not tender and no mass present.    Left Adnexa: not tender and no mass present.    No cervical polyp.     IUD strings visualized.     Uterus is not enlarged or tender.  Breasts:    Right: No mass, nipple discharge, skin change or tenderness.     Left: No mass, nipple discharge, skin change or tenderness.  Neck:     Thyroid: No thyromegaly.  Cardiovascular:     Rate and Rhythm: Normal rate and regular rhythm.     Heart sounds: Normal heart sounds. No murmur heard. Pulmonary:     Effort: Pulmonary effort is normal.     Breath sounds: Normal breath sounds.  Abdominal:     Palpations: Abdomen is soft.     Tenderness: There is no abdominal tenderness. There is no guarding.  Musculoskeletal:        General: Normal range of motion.     Cervical back: Normal range of motion.  Neurological:     General: No focal deficit present.     Mental Status: She is alert and oriented to person, place, and time.     Cranial Nerves: No cranial nerve deficit.  Skin:    General: Skin is warm and dry.  Psychiatric:        Mood and Affect: Mood normal.        Behavior: Behavior normal.        Thought Content: Thought content normal.        Judgment: Judgment normal.  Vitals reviewed.     Assessment/Plan: Encounter for annual routine gynecological examination  Encounter for routine checking of intrauterine contraceptive device (IUD); IUD in place. Due for rem 06/12/22      GYN counsel adequate intake of calcium and vitamin D, diet and exercise     F/U  No follow-ups on file.  Tamula Morrical B. Rocsi Hazelbaker, PA-C 01/04/2022 8:28 PM

## 2022-01-05 ENCOUNTER — Ambulatory Visit (INDEPENDENT_AMBULATORY_CARE_PROVIDER_SITE_OTHER): Payer: 59 | Admitting: Obstetrics and Gynecology

## 2022-01-05 ENCOUNTER — Other Ambulatory Visit (HOSPITAL_COMMUNITY)
Admission: RE | Admit: 2022-01-05 | Discharge: 2022-01-05 | Disposition: A | Discharge status: Home / Self Care | Payer: 59 | Source: Ambulatory Visit | Attending: Obstetrics and Gynecology | Admitting: Obstetrics and Gynecology

## 2022-01-05 ENCOUNTER — Encounter: Payer: Self-pay | Admitting: Obstetrics and Gynecology

## 2022-01-05 VITALS — BP 120/80 | Ht 66.0 in | Wt 186.0 lb

## 2022-01-05 DIAGNOSIS — Z0000 Encounter for general adult medical examination without abnormal findings: Secondary | ICD-10-CM | POA: Diagnosis not present

## 2022-01-05 DIAGNOSIS — Z124 Encounter for screening for malignant neoplasm of cervix: Secondary | ICD-10-CM

## 2022-01-05 DIAGNOSIS — Z30431 Encounter for routine checking of intrauterine contraceptive device: Secondary | ICD-10-CM

## 2022-01-05 DIAGNOSIS — Z1151 Encounter for screening for human papillomavirus (HPV): Secondary | ICD-10-CM | POA: Insufficient documentation

## 2022-01-05 DIAGNOSIS — Z13220 Encounter for screening for lipoid disorders: Secondary | ICD-10-CM

## 2022-01-05 DIAGNOSIS — Z01419 Encounter for gynecological examination (general) (routine) without abnormal findings: Secondary | ICD-10-CM | POA: Diagnosis not present

## 2022-01-05 NOTE — Patient Instructions (Signed)
I value your feedback and you entrusting us with your care. If you get a [facility] patient survey, I would appreciate you taking the time to let us know about your experience today. Thank you! ? ? ?

## 2022-01-06 LAB — LIPID PANEL
Chol/HDL Ratio: 2.9 ratio (ref 0.0–4.4)
Cholesterol, Total: 169 mg/dL (ref 100–199)
HDL: 59 mg/dL (ref >39)
LDL Chol Calc (NIH): 98 mg/dL (ref 0–99)
Triglycerides: 64 mg/dL (ref 0–149)
VLDL Cholesterol Cal: 12 mg/dL (ref 5–40)

## 2022-01-06 LAB — COMPREHENSIVE METABOLIC PANEL
ALT: 13 IU/L (ref 0–32)
AST: 10 IU/L (ref 0–40)
Albumin/Globulin Ratio: 1.6 (ref 1.2–2.2)
Albumin: 4.2 g/dL (ref 3.9–4.9)
Alkaline Phosphatase: 70 IU/L (ref 44–121)
BUN/Creatinine Ratio: 11 (ref 9–23)
BUN: 9 mg/dL (ref 6–20)
Bilirubin Total: 1.2 mg/dL (ref 0.0–1.2)
CO2: 22 mmol/L (ref 20–29)
Calcium: 9.1 mg/dL (ref 8.7–10.2)
Chloride: 105 mmol/L (ref 96–106)
Creatinine, Ser: 0.8 mg/dL (ref 0.57–1.00)
Globulin, Total: 2.6 g/dL (ref 1.5–4.5)
Glucose: 97 mg/dL (ref 70–99)
Potassium: 4.4 mmol/L (ref 3.5–5.2)
Sodium: 138 mmol/L (ref 134–144)
Total Protein: 6.8 g/dL (ref 6.0–8.5)
eGFR: 96 mL/min/{1.73_m2} (ref >59)

## 2022-01-09 LAB — CYTOLOGY - PAP
Comment: NEGATIVE
Diagnosis: NEGATIVE
High risk HPV: NEGATIVE

## 2022-03-05 ENCOUNTER — Other Ambulatory Visit: Payer: Self-pay

## 2022-03-05 DIAGNOSIS — M545 Low back pain, unspecified: Secondary | ICD-10-CM | POA: Diagnosis not present

## 2022-03-05 MED ORDER — METHYLPREDNISOLONE 4 MG PO TBPK
ORAL_TABLET | ORAL | 0 refills | Status: DC
  Filled 2022-03-05: qty 21, 6d supply, fill #0

## 2022-03-05 MED ORDER — METHOCARBAMOL 750 MG PO TABS
ORAL_TABLET | ORAL | 0 refills | Status: DC
  Filled 2022-03-05: qty 30, 10d supply, fill #0

## 2022-03-06 ENCOUNTER — Other Ambulatory Visit: Payer: Self-pay

## 2022-06-16 ENCOUNTER — Other Ambulatory Visit: Payer: Self-pay | Admitting: Obstetrics and Gynecology

## 2022-06-16 DIAGNOSIS — Z1231 Encounter for screening mammogram for malignant neoplasm of breast: Secondary | ICD-10-CM

## 2022-06-27 ENCOUNTER — Ambulatory Visit
Admission: RE | Admit: 2022-06-27 | Discharge: 2022-06-27 | Disposition: A | Discharge status: Home / Self Care | Payer: 59 | Source: Ambulatory Visit | Attending: Obstetrics and Gynecology | Admitting: Obstetrics and Gynecology

## 2022-06-27 DIAGNOSIS — Z1231 Encounter for screening mammogram for malignant neoplasm of breast: Secondary | ICD-10-CM | POA: Diagnosis not present

## 2023-01-11 ENCOUNTER — Ambulatory Visit: Payer: 59

## 2023-01-11 VITALS — BP 120/80 | Ht 66.0 in | Wt 182.0 lb

## 2023-01-11 DIAGNOSIS — Z01419 Encounter for gynecological examination (general) (routine) without abnormal findings: Secondary | ICD-10-CM | POA: Diagnosis not present

## 2023-01-11 NOTE — Assessment & Plan Note (Signed)
-   Reviewed health maintenance topics as documented below. - Last pap smear was normal in 2023, reviewed ASCCP guidelines and opts for co-testing at 5 year intervals. Will be due in 2028. - Mirena IUD in place since 2018, having some spotting. Reviewed FDA approval for contraception through 8 years, can be replaced earlier if spotting becomes more persistent or bothersome.  - Declined STI testing, low risk. - Labs collected today: CBC, TSH, Hgb A1c

## 2023-01-11 NOTE — Progress Notes (Signed)
Outpatient Gynecology Note: Annual Visit  Assessment/Plan:    Caitlin Fox is a 42 y.o. female [phone] with normal well-woman gynecologic exam.   Well woman exam - Reviewed health maintenance topics as documented below. - Last pap smear was normal in 2023, reviewed ASCCP guidelines and opts for co-testing at 5 year intervals. Will be due in 2028. - Mirena IUD in place since 2018, having some spotting. Reviewed FDA approval for contraception through 8 years, can be replaced earlier if spotting becomes more persistent or bothersome.  - Declined STI testing, low risk. - Labs collected today: CBC, TSH, Hgb A1c     Risk factors identified in Subjective to review: none Orders Placed This Encounter  Procedures   CBC   TSH   Hemoglobin A1c   Current Outpatient Medications  Medication Instructions   ibuprofen (ADVIL) 400 mg, Oral, Every 6 hours PRN   levonorgestrel (MIRENA) 20 MCG/24HR IUD 1 each, Intrauterine,  Once   methocarbamol (ROBAXIN) 750 MG tablet Take 1 tablet 3 times a day by mouth as needed for 10 days.   methylPREDNISolone (MEDROL) 4 MG TBPK tablet Take by mouth as directed on package for 6 days    No follow-ups on file.    Subjective:    Caitlin Fox is a 42 y.o. female [id number] who presents for annual wellness visit.   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 No concerns  Well Woman Visit:  GYN HISTORY:  No LMP recorded. (Menstrual status: IUD).     Menstrual History: OB History     Gravida  2   Para  2   Term  2   Preterm      AB      Living  2      SAB      IAB      Ectopic      Multiple      Live Births  2           Menarche age: [age] No LMP recorded. (Menstrual status: IUD). Period Pattern: (!) Irregular Menstrual Flow: Light Dysmenorrhea: None   Intermenstrual bleeding, spotting, or discharge? Blood tinged discharge with Mirena IUD, placed in  2018 Urinary incontinence? Occasional stress incontinence  Sexually active: yes Number of sexual partners: one Gender of sexual Partners: male Dyspareunia? Occasional in ovarian area Last pap: 2023, NILM, HPV neg History of abnormal Pap: neg STI history: no Contraceptive methods: Mirena IUD.  Health Maintenance > Reviewed breast self-awareness, grandmother on maternal side possibly > History of abnormal mammogram: No > Exercise: aerobics and walking, moderately active > Dietary Supplements: multivitamin > Body mass index is 29.38 kg/m. : > Recent dental visit Yes.   > Seat Belt Use: Yes.   > Texting and driving? No. > Guns in the house Yes.  , locked > STI/HIV testing or immunizations needed? No. > Concern for alcohol abuse? none   Tobacco or other drug use: denied. Tobacco Use: Low Risk  (01/11/2023)   Patient History    Smoking Tobacco Use: Never    Smokeless Tobacco Use: Never    Passive Exposure: Not on file     PHQ-2 Score: In last two weeks, how often have you felt: Little interest or pleasure in doing things: Not at all (0) Feeling down, depressed or hopeless: Not at all (0) Score: 0  GAD-2 Over the last 2 weeks, how  often have you been bothered by the following problems? Feeling nervous, anxious or on edge: Not at all (0) Not being able to stop or control worrying: Not at all (0)} Score: 0  If >40:  Last mammogram:  2024, BIRADS 1 Last lipid screening: 2023  _________________________________________________________  Current Outpatient Medications  Medication Sig Dispense Refill   ibuprofen (ADVIL,MOTRIN) 400 MG tablet Take 400 mg by mouth every 6 (six) hours as needed.     levonorgestrel (MIRENA) 20 MCG/24HR IUD 1 each by Intrauterine route once.     methocarbamol (ROBAXIN) 750 MG tablet Take 1 tablet 3 times a day by mouth as needed for 10 days. 30 tablet 0   methylPREDNISolone (MEDROL) 4 MG TBPK tablet Take by mouth as directed on package for 6 days  21 tablet 0   No current facility-administered medications for this visit.   No Known Allergies  Past Medical History:  Diagnosis Date   Breast mass in female 2013   cystic lesion. followed by Dr. Lemar Livings for breast complaints   Family history of breast cancer    Galactorrhea    Mastodynia, female    Past Surgical History:  Procedure Laterality Date   CESAREAN SECTION  2007   BVD/ FITL   INTRAUTERINE DEVICE (IUD) INSERTION  06-14-2006, 06-16-2011, 06/12/2016   SINUS SURGERY WITH INSTATRAK     OB History     Gravida  2   Para  2   Term  2   Preterm      AB      Living  2      SAB      IAB      Ectopic      Multiple      Live Births  2          Social History   Tobacco Use   Smoking status: Never   Smokeless tobacco: Never  Substance Use Topics   Alcohol use: No   Social History   Substance and Sexual Activity  Sexual Activity Yes   Birth control/protection: I.U.D.   Comment: Mirena    Immunization History  Administered Date(s) Administered   PFIZER(Purple Top)SARS-COV-2 Vaccination 05/19/2019, 06/09/2019     Review Of Systems  Constitutional: Denied constitutional symptoms, night sweats, recent illness, fatigue, fever, insomnia and weight loss.  Eyes: Denied eye symptoms, eye pain, photophobia, vision change and visual disturbance.  Ears/Nose/Throat/Neck: Denied ear, nose, throat or neck symptoms, hearing loss, nasal discharge, sinus congestion and sore throat.  Cardiovascular: Denied cardiovascular symptoms, arrhythmia, chest pain/pressure, edema, exercise intolerance, orthopnea and palpitations.  Respiratory: Denied pulmonary symptoms, asthma, pleuritic pain, productive sputum, cough, dyspnea and wheezing.  Gastrointestinal: Denied, gastro-esophageal reflux, melena, nausea and vomiting.  Genitourinary: Denied genitourinary symptoms including symptomatic vaginal discharge, pelvic relaxation issues, and urinary complaints.  Musculoskeletal:  Denied musculoskeletal symptoms, stiffness, swelling, muscle weakness and myalgia.  Dermatologic: Denied dermatology symptoms, rash and scar.  Neurologic: Denied neurology symptoms, dizziness, headache, neck pain and syncope.  Psychiatric: Denied psychiatric symptoms, anxiety and depression.  Endocrine: Denied endocrine symptoms including hot flashes and night sweats.      Objective:    BP 120/80   Ht 5\' 6"  (1.676 m)   Wt 182 lb (82.6 kg)   BMI 29.38 kg/m   Constitutional: Well-developed, well-nourished female in no acute distress Neurological: Alert and oriented to person, place, and time Psychiatric: Mood and affect appropriate Skin: No rashes or lesions Neck: Supple without masses. Trachea is midline.Thyroid is normal size  without masses Lymphatics: No cervical, axillary, supraclavicular, or inguinal adenopathy noted Respiratory: Clear to auscultation bilaterally. Good air movement with normal work of breathing. Cardiovascular: Regular rate and rhythm. Extremities grossly normal, nontender with no edema; pulses regular Gastrointestinal: Soft, nontender, nondistended. No masses or hernias appreciated. No hepatosplenomegaly. No fluid wave. No rebound or guarding. Breast Exam:  deferred with shared decision-making Genitourinary:  deferred Rectal: deferred    Autumn Messing, CNM  01/11/23 10:04 AM

## 2023-01-12 LAB — CBC
Hematocrit: 40.2 % (ref 34.0–46.6)
Hemoglobin: 13 g/dL (ref 11.1–15.9)
MCH: 28.9 pg (ref 26.6–33.0)
MCHC: 32.3 g/dL (ref 31.5–35.7)
MCV: 89 fL (ref 79–97)
Platelets: 206 10*3/uL (ref 150–450)
RBC: 4.5 x10E6/uL (ref 3.77–5.28)
RDW: 12.2 % (ref 11.7–15.4)
WBC: 5.1 10*3/uL (ref 3.4–10.8)

## 2023-01-12 LAB — HEMOGLOBIN A1C
Est. average glucose Bld gHb Est-mCnc: 94 mg/dL
Hgb A1c MFr Bld: 4.9 % (ref 4.8–5.6)

## 2023-01-12 LAB — TSH: TSH: 1.48 u[IU]/mL (ref 0.450–4.500)

## 2023-01-25 ENCOUNTER — Ambulatory Visit: Payer: 59 | Admitting: Obstetrics and Gynecology

## 2023-04-23 DIAGNOSIS — H52213 Irregular astigmatism, bilateral: Secondary | ICD-10-CM | POA: Diagnosis not present

## 2023-08-01 ENCOUNTER — Other Ambulatory Visit: Payer: Self-pay | Admitting: Obstetrics and Gynecology

## 2023-08-01 DIAGNOSIS — Z1231 Encounter for screening mammogram for malignant neoplasm of breast: Secondary | ICD-10-CM

## 2023-08-14 ENCOUNTER — Ambulatory Visit
Admission: RE | Admit: 2023-08-14 | Discharge: 2023-08-14 | Disposition: A | Discharge status: Home / Self Care | Source: Ambulatory Visit | Attending: Obstetrics and Gynecology | Admitting: Obstetrics and Gynecology

## 2023-08-14 DIAGNOSIS — Z1231 Encounter for screening mammogram for malignant neoplasm of breast: Secondary | ICD-10-CM

## 2023-08-16 ENCOUNTER — Encounter: Payer: Self-pay | Admitting: Obstetrics and Gynecology
